# Patient Record
Sex: Female | Born: 1970 | Race: White | Hispanic: No | Smoking: Never smoker
Health system: Southern US, Community
[De-identification: ages and names within clinical notes are randomized; demographics above are authoritative.]

## PROBLEM LIST (undated history)

## (undated) DIAGNOSIS — E079 Disorder of thyroid, unspecified: Secondary | ICD-10-CM

## (undated) DIAGNOSIS — F32A Depression, unspecified: Secondary | ICD-10-CM

## (undated) DIAGNOSIS — J309 Allergic rhinitis, unspecified: Secondary | ICD-10-CM

## (undated) DIAGNOSIS — G43909 Migraine, unspecified, not intractable, without status migrainosus: Secondary | ICD-10-CM

## (undated) DIAGNOSIS — F329 Major depressive disorder, single episode, unspecified: Secondary | ICD-10-CM

## (undated) HISTORY — PX: APPENDECTOMY: SHX54

## (undated) HISTORY — PX: NASAL SINUS SURGERY: SHX719

## (undated) HISTORY — PX: ABDOMINAL HYSTERECTOMY: SHX81

## (undated) HISTORY — PX: TONSILLECTOMY: SUR1361

## (undated) HISTORY — PX: BACK SURGERY: SHX140

---
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
## 2002-05-19 DIAGNOSIS — N83209 Unspecified ovarian cyst, unspecified side: Secondary | ICD-10-CM

## 2002-05-19 DIAGNOSIS — C55 Malignant neoplasm of uterus, part unspecified: Secondary | ICD-10-CM

## 2002-05-19 HISTORY — DX: Unspecified ovarian cyst, unspecified side: N83.209

## 2002-05-19 HISTORY — DX: Malignant neoplasm of uterus, part unspecified: C55

## 2019-05-03 DIAGNOSIS — Z8542 Personal history of malignant neoplasm of other parts of uterus: Secondary | ICD-10-CM | POA: Insufficient documentation

## 2019-05-03 LAB — HM PAP SMEAR

## 2019-06-01 ENCOUNTER — Other Ambulatory Visit: Payer: Self-pay

## 2019-06-01 ENCOUNTER — Encounter (HOSPITAL_COMMUNITY): Payer: Self-pay | Admitting: Emergency Medicine

## 2019-06-01 ENCOUNTER — Emergency Department (HOSPITAL_COMMUNITY)
Admission: EM | Admit: 2019-06-01 | Discharge: 2019-06-01 | Disposition: A | Discharge status: Home / Self Care | Attending: Emergency Medicine | Admitting: Emergency Medicine

## 2019-06-01 DIAGNOSIS — Z79899 Other long term (current) drug therapy: Secondary | ICD-10-CM | POA: Insufficient documentation

## 2019-06-01 DIAGNOSIS — R519 Headache, unspecified: Secondary | ICD-10-CM | POA: Diagnosis not present

## 2019-06-01 DIAGNOSIS — E039 Hypothyroidism, unspecified: Secondary | ICD-10-CM | POA: Diagnosis not present

## 2019-06-01 HISTORY — DX: Disorder of thyroid, unspecified: E07.9

## 2019-06-01 HISTORY — DX: Depression, unspecified: F32.A

## 2019-06-01 HISTORY — DX: Migraine, unspecified, not intractable, without status migrainosus: G43.909

## 2019-06-01 HISTORY — DX: Allergic rhinitis, unspecified: J30.9

## 2019-06-01 MED ORDER — DIPHENHYDRAMINE HCL 50 MG/ML IJ SOLN
25.0000 mg | Freq: Once | INTRAMUSCULAR | Status: AC
  Administered 2019-06-01: 25 mg via INTRAVENOUS
  Filled 2019-06-01: qty 1

## 2019-06-01 MED ORDER — DEXAMETHASONE SODIUM PHOSPHATE 10 MG/ML IJ SOLN
10.0000 mg | Freq: Once | INTRAMUSCULAR | Status: AC
  Administered 2019-06-01: 03:00:00 10 mg via INTRAVENOUS
  Filled 2019-06-01: qty 1

## 2019-06-01 MED ORDER — METOCLOPRAMIDE HCL 5 MG/ML IJ SOLN
10.0000 mg | Freq: Once | INTRAMUSCULAR | Status: AC
  Administered 2019-06-01: 03:00:00 10 mg via INTRAVENOUS
  Filled 2019-06-01: qty 2

## 2019-06-01 MED ORDER — SODIUM CHLORIDE 0.9 % IV BOLUS
1000.0000 mL | Freq: Once | INTRAVENOUS | Status: AC
  Administered 2019-06-01: 03:00:00 1000 mL via INTRAVENOUS

## 2019-06-01 NOTE — ED Triage Notes (Signed)
Pt states that she has been having migraine (for which she relates hx of) since yesterday at 11am.  Pt c/o nausea w/o emesis, sensitive to light and sound.  Takes topamax 100 mg amovig 140 mg, rizatriptan benzoate10 mg. Has taken all her home meds with no relief.

## 2019-06-01 NOTE — Discharge Instructions (Signed)
Can continue your medications at home when needed. Follow-up with your neurologist as scheduled, sooner if any ongoing issues. Return here for any new/acute changes.

## 2019-06-01 NOTE — ED Provider Notes (Signed)
[facility] DEPT Provider Note   CSN: [id number] Arrival date & time: 06/01/19  0031     History Chief Complaint  Patient presents with  . Migraine    Tricia Moore is a 49 y.o. female.  The history is provided by the patient and medical records.  Migraine Associated symptoms include headaches.    49 year old female with history of seasonal allergies, depression, thyroid disease, migraine headache, presenting to ED with headache.  States migraine began yesterday morning and has been steadily worsening since onset.  Pain is generalized, throbbing in nature with associated photophobia and nausea.  She denies any focal numbness, weakness, dizziness, confusion, changes in speech, or difficulty walking.  She is not had any fever, chills, neck pain or stiffness.  She is currently maxed out on her home migraine medications and continues to have headache.  States she has only had to come to the ED once before and was given a "migraine cocktail" with good improvement.  She is not currently on anticoagulation.  Denies any falls or head trauma.  She does follow with neurology at [facility] for chronic headaches/migraines.  Past Medical History:  Diagnosis Date  . Allergic rhinitis   . Depression   . Migraines   . Thyroid disease    hypothyroid    There are no problems to display for this patient.   Past Surgical History:  Procedure Laterality Date  . ABDOMINAL HYSTERECTOMY    . APPENDECTOMY    . CESAREAN SECTION    . NASAL SINUS SURGERY    . TONSILLECTOMY       OB History   No obstetric history on file.     No family history on file.  Social History   Tobacco Use  . Smoking status: Never Smoker  . Smokeless tobacco: Never Used  Substance Use Topics  . Alcohol use: Never  . Drug use: Not on file    Home Medications Prior to Admission medications   Medication Sig Start Date End Date Taking? Authorizing Provider  buPROPion  (WELLBUTRIN XL) 150 MG 24 hr tablet Take 150 mg by mouth daily.    Yes [provider]  citalopram (CELEXA) 40 MG tablet Take 40 mg by mouth daily.  05/09/19  Yes [provider]  Erenumab-aooe (AIMOVIG) 140 MG/ML SOAJ Inject 140 mg into the skin every 30 (thirty) days. 05/25/19 05/25/20 Yes [provider]  fluticasone (FLONASE) 50 MCG/ACT nasal spray Place 2 sprays into both nostrils daily.    Yes [provider]  levothyroxine (SYNTHROID) 50 MCG tablet Take 50 mcg by mouth daily. 03/18/19  Yes [provider]  montelukast (SINGULAIR) 10 MG tablet Take 10 mg by mouth at bedtime.  03/14/19  Yes [provider]  OSPHENA 60 MG TABS Take 1 tablet by mouth daily. 03/14/19  Yes [provider]  [place] Med Name: Shaklee Vitamins - +D Boost 2,000 IU 8000 x2; OsteoMatrix 100% +DV of calcium and manganese; ALfalfa Complex; Vita Lea with Iron multivitamin.   Yes [provider]  rizatriptan (MAXALT) 10 MG tablet 1 at HA onset, may repeat in 2h, max dose 2 in 24h, 2 days/week. 03/28/19  Yes [provider]  terconazole (TERAZOL 3) 80 MG vaginal suppository Place 80 mg vaginally at bedtime as needed.    Yes [provider]  topiramate (TOPAMAX) 100 MG tablet Take 100 mg by mouth at bedtime. 05/25/19  Yes [provider]    Allergies  Penicillins, Hydrocodone-acetaminophen, Quinolones, and Alpha blocker quinazolines  Review of Systems   Review of Systems  Neurological: Positive for headaches.  All other systems reviewed and are negative.   Physical Exam Updated Vital Signs BP 101/61   Pulse 68   Temp 98.5 F (36.9 C) (Oral)   Resp 16   Ht 5\' 8"  (1.727 m)   Wt 79.8 kg   SpO2 94%   BMI 26.76 kg/m   Physical Exam Vitals and nursing note reviewed.  Constitutional:      General: She is not in acute distress.    Appearance: She is well-developed. She is not diaphoretic.      Comments: Lying in dark room, blanket over eyes  HENT:     Head: Normocephalic and atraumatic.     Right Ear: External ear normal.     Left Ear: External ear normal.  Eyes:     Conjunctiva/sclera: Conjunctivae normal.     Pupils: Pupils are equal, round, and reactive to light.  Neck:     Comments: No rigidity, no meningismus Cardiovascular:     Rate and Rhythm: Normal rate and regular rhythm.     Heart sounds: Normal heart sounds. No murmur.  Pulmonary:     Effort: Pulmonary effort is normal. No respiratory distress.     Breath sounds: Normal breath sounds. No wheezing or rhonchi.  Abdominal:     General: Bowel sounds are normal.     Palpations: Abdomen is soft.     Tenderness: There is no abdominal tenderness. There is no guarding.  Musculoskeletal:        General: Normal range of motion.     Cervical back: Full passive range of motion without pain, normal range of motion and neck supple. No rigidity.  Skin:    General: Skin is warm and dry.     Findings: No rash.  Neurological:     Mental Status: She is alert and oriented to person, place, and time.     Cranial Nerves: No cranial nerve deficit.     Sensory: No sensory deficit.     Motor: No tremor or seizure activity.     Comments: AAOx3, answering questions and following commands appropriately; equal strength UE and LE bilaterally; CN grossly intact; moves all extremities appropriately without ataxia; no focal neuro deficits or facial asymmetry appreciated  Psychiatric:        Behavior: Behavior normal.        Thought Content: Thought content normal.     ED Results / Procedures / Treatments   Labs (all labs ordered are listed, but only abnormal results are displayed) Labs Reviewed - No data to display  EKG None  Radiology No results found.  Procedures Procedures (including critical care time)  Medications Ordered in ED Medications  sodium chloride 0.9 % bolus 1,000 mL (0 mLs Intravenous Stopped 06/01/19 0358)    diphenhydrAMINE (BENADRYL) injection 25 mg (25 mg Intravenous Given 06/01/19 0321)  metoCLOPramide (REGLAN) injection 10 mg (10 mg Intravenous Given 06/01/19 0323)  dexamethasone (DECADRON) injection 10 mg (10 mg Intravenous Given 06/01/19 0322)    ED Course  I have reviewed the triage vital signs and the nursing notes.  Pertinent labs & imaging results that were available during my care of the patient were reviewed by me and considered in my medical decision making (see chart for details).    MDM Rules/Calculators/A&P    49 year old female presenting to the ED with migraine headache.  This began yesterday morning and  has been steadily worsening.  She has a history of same and is currently maxed out on her home medications.  Headache is generalized, throbbing in nature with associated photophobia and nausea which is typical for her.  She is afebrile and nontoxic in appearance.  Neurologic exam is nonfocal.  No meningeal signs.  States she has only had to come to the hospital one other time and was given a "migraine cocktail" with resolution of headache.  IV fluids and medications ordered here.  Will reassess.  5:07 AM Patient feeling better after migraine cocktail and wants to go home.  Feel this is reasonable as she remains neurologically intake with stable VS. Can continue her prescribed medications when needed.  Neurology follow-up as needed for any ongoing issues.  Return here for any new/acute changes.  Final Clinical Impression(s) / ED Diagnoses Final diagnoses:  Bad headache    Rx / DC Orders ED Discharge Orders    None       Larene Pickett, PA-C 06/01/19 0514    Shanon Rosser, MD 06/01/19 [phone]

## 2019-07-20 DIAGNOSIS — R768 Other specified abnormal immunological findings in serum: Secondary | ICD-10-CM | POA: Insufficient documentation

## 2019-11-18 DIAGNOSIS — Z91030 Bee allergy status: Secondary | ICD-10-CM | POA: Insufficient documentation

## 2019-12-28 DIAGNOSIS — K602 Anal fissure, unspecified: Secondary | ICD-10-CM | POA: Insufficient documentation

## 2020-02-22 DIAGNOSIS — K644 Residual hemorrhoidal skin tags: Secondary | ICD-10-CM | POA: Insufficient documentation

## 2020-04-10 ENCOUNTER — Ambulatory Visit (HOSPITAL_COMMUNITY)
Admission: EM | Admit: 2020-04-10 | Discharge: 2020-04-10 | Disposition: A | Discharge status: Home / Self Care | Attending: Family Medicine | Admitting: Family Medicine

## 2020-04-10 ENCOUNTER — Encounter (HOSPITAL_COMMUNITY): Payer: Self-pay | Admitting: Emergency Medicine

## 2020-04-10 ENCOUNTER — Other Ambulatory Visit: Payer: Self-pay

## 2020-04-10 ENCOUNTER — Ambulatory Visit (INDEPENDENT_AMBULATORY_CARE_PROVIDER_SITE_OTHER)

## 2020-04-10 DIAGNOSIS — S0592XA Unspecified injury of left eye and orbit, initial encounter: Secondary | ICD-10-CM | POA: Diagnosis not present

## 2020-04-10 DIAGNOSIS — S0012XA Contusion of left eyelid and periocular area, initial encounter: Secondary | ICD-10-CM

## 2020-04-10 DIAGNOSIS — H5712 Ocular pain, left eye: Secondary | ICD-10-CM

## 2020-04-10 MED ORDER — IBUPROFEN 800 MG PO TABS: 800.0000 mg | ORAL_TABLET | Freq: Three times a day (TID) | ORAL | 0 refills | Status: DC
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
## 2020-04-10 NOTE — Discharge Instructions (Signed)
Continue ice for 20 minutes every couple of hours Take ibuprofen 3 times a day with food See eye specialty if you fail to improve

## 2020-06-14 DIAGNOSIS — Z1211 Encounter for screening for malignant neoplasm of colon: Secondary | ICD-10-CM | POA: Insufficient documentation

## 2021-06-04 ENCOUNTER — Encounter: Payer: Self-pay | Admitting: Internal Medicine

## 2021-06-04 ENCOUNTER — Ambulatory Visit (INDEPENDENT_AMBULATORY_CARE_PROVIDER_SITE_OTHER): Payer: 59 | Admitting: Internal Medicine

## 2021-06-04 VITALS — BP 128/85 | HR 89 | Wt 207.2 lb

## 2021-06-04 DIAGNOSIS — F32A Depression, unspecified: Secondary | ICD-10-CM | POA: Diagnosis not present

## 2021-06-04 DIAGNOSIS — M797 Fibromyalgia: Secondary | ICD-10-CM | POA: Diagnosis not present

## 2021-06-04 DIAGNOSIS — M5416 Radiculopathy, lumbar region: Secondary | ICD-10-CM

## 2021-06-04 DIAGNOSIS — R635 Abnormal weight gain: Secondary | ICD-10-CM

## 2021-06-04 DIAGNOSIS — M4727 Other spondylosis with radiculopathy, lumbosacral region: Secondary | ICD-10-CM

## 2021-06-04 DIAGNOSIS — E039 Hypothyroidism, unspecified: Secondary | ICD-10-CM

## 2021-06-04 DIAGNOSIS — C55 Malignant neoplasm of uterus, part unspecified: Secondary | ICD-10-CM

## 2021-06-04 DIAGNOSIS — G43909 Migraine, unspecified, not intractable, without status migrainosus: Secondary | ICD-10-CM

## 2021-06-04 MED ORDER — LEVOTHYROXINE SODIUM 75 MCG PO TABS: 75.0000 ug | ORAL_TABLET | Freq: Every day | ORAL | 1 refills | Status: DC

## 2021-06-04 NOTE — Progress Notes (Signed)
° °  CC: establish care  HPI:[name] is a 51 y.o. female who presents for evaluation of establish care. Please see individual problem based A/P for details.  Patient here to establish care. She reports she was previously established with [facility], however, she recently changed insurance plans and [facility] does not accept her new plan. Furthermore, she is not able to see the specialists that she used to follow with. Requesting referral to spine, migraine clinic, rheumatologist.   Medications - buproprion 300 - Duloxetine 60  - Montelukast - 10mg  qhs - Lyrica 75  - Osphena 60  - aimovig 140  - Nurtec 75 - levothyroxine 100  - tramadol - methocarbamol -500 TID   Surg Hx - hysterectomy with oophorectomy for uterine cancer - appendectomy - tonsillectomy   Family hx - hypothyroid - ovarian and breast cancer - aunts breast cancer, mom uterine cancer,   Social Hx - alc none to 2-3 per week. No tobacco - works as Furniture conservator/restorer for [employer] - lives with service dog and cat, sister lives around corner, daughters talk - wanting to be more active agaiin.  Alergies - penicilin and hydrocodone hives, wasps/hornets systemic,    Depression, PHQ-9: Based on the patients  score we have.  Past Medical History:  Diagnosis Date   Allergic rhinitis    Depression    Migraines    Thyroid disease    hypothyroid   Review of Systems:   Review of Systems  Constitutional: Negative.   HENT: Negative.    Eyes: Negative.   Respiratory: Negative.    Cardiovascular: Negative.   Gastrointestinal: Negative.   Genitourinary: Negative.   Musculoskeletal: Negative.   Skin: Negative.   Neurological: Negative.   Endo/Heme/Allergies: Negative.   Psychiatric/Behavioral: Negative.      Physical Exam: Vitals:   06/04/21 0903  BP: 128/85  Pulse: 89  SpO2: 98%  Weight: 207 lb 3.2 oz (94 kg)     General: alert and oriented HEENT: Conjunctiva nl  , antiicteric sclerae, moist mucous membranes, no exudate or erythema Cardiovascular: Normal rate, regular rhythm.  No murmurs, rubs, or gallops Pulmonary : Equal breath sounds, No wheezes, rales, or rhonchi Abdominal: soft, nontender,  bowel sounds present Ext: No edema in lower extremities, no tenderness to palpation of lower extremities.   Assessment & Plan:   See Encounters Tab for problem based charting.  Patient discussed with Dr.  Christene Slates

## 2021-06-04 NOTE — Patient Instructions (Signed)
Dear Mrs. Davidson-Mayer,  Thank you for trusting [place] with your care.   I have sent in a refill for your thyroid medication. I have also placed a referral to neurosurgery, neurology, podiatry, rheumatology, dietician, weight management, and behavioral health.  We will plan to see you back in the clinic in 3 months to follow up. If the neurosurgeons choose not to manage your tramadol, please call our office to schedule an appointment approximately 1 week before you run out of medication to fill out a pain contract.

## 2021-06-05 ENCOUNTER — Encounter: Payer: Self-pay | Admitting: Internal Medicine

## 2021-06-05 DIAGNOSIS — M4727 Other spondylosis with radiculopathy, lumbosacral region: Secondary | ICD-10-CM | POA: Insufficient documentation

## 2021-06-05 DIAGNOSIS — G43909 Migraine, unspecified, not intractable, without status migrainosus: Secondary | ICD-10-CM | POA: Insufficient documentation

## 2021-06-05 DIAGNOSIS — M797 Fibromyalgia: Secondary | ICD-10-CM | POA: Insufficient documentation

## 2021-06-05 DIAGNOSIS — E039 Hypothyroidism, unspecified: Secondary | ICD-10-CM | POA: Insufficient documentation

## 2021-06-05 DIAGNOSIS — F32A Depression, unspecified: Secondary | ICD-10-CM | POA: Insufficient documentation

## 2021-06-05 DIAGNOSIS — C55 Malignant neoplasm of uterus, part unspecified: Secondary | ICD-10-CM | POA: Insufficient documentation

## 2021-06-05 LAB — TSH: TSH: 0.79 u[IU]/mL (ref 0.450–4.500)

## 2021-06-05 NOTE — Assessment & Plan Note (Addendum)
Patient states she has followed with rheumatology in the past for this issue. She currently takes Duloxetine and lyrica. Asymptomatic today. She is wanting to re-establish with rheumatologist. Sent referral to [facility] to assist in [place].

## 2021-06-05 NOTE — Assessment & Plan Note (Signed)
Patient denies symptoms today. Used to see psych and had counselor. She takes Bupropion 300 and Duloxetine 60.  Will continue current management. Referrals to behavioral health placed.

## 2021-06-05 NOTE — Assessment & Plan Note (Signed)
Takes methocarbamol 500 TID, Lyrica and duloxetine. She also takes Tramadol occasionally at night. Reports 20 pills are usually enough to last her 2 months.  She reports that she was receiving spinal injections through [facility] in [place].  Sent referral to [facility] to assist in management with spinal injections. If [facility] chooses not to manage her pain, will need to form a pain contract with patient.  - Continue Methocarbamol 500 TID, Lyrica, Duloxetine, and tramadol.

## 2021-06-05 NOTE — Assessment & Plan Note (Signed)
Patient reports she used to follow with neurologsit through migraine clinic in [place].  Requesting referral to neurology Asymptomatic today. Currently managed on Aimovig 140 and Nurtec 75. Will continue these medications for now.

## 2021-06-05 NOTE — Assessment & Plan Note (Signed)
Taking Osphena 60. Does endorse some hot flashes which she relates to perimenopause Continue Osphena

## 2021-06-05 NOTE — Assessment & Plan Note (Signed)
Patient reports being diagnosed 2021. Has had associated weight gain and fatigue.  TSH checked and wnl. Continue levothyroxine 100

## 2021-06-06 ENCOUNTER — Encounter: Payer: Self-pay | Admitting: Neurology

## 2021-06-06 NOTE — Progress Notes (Signed)
Internal Medicine Clinic Attending  Case discussed with Dr. Elliot Gurney  At the time of the visit.  We reviewed the residents history and exam and pertinent patient test results.  I agree with the assessment, diagnosis, and plan of care documented in the residents note.   Medically complex patient here to establish care. She was previously seeing several specialists, but insurance changed, so is requesting new referrals today. I think this is reasonable and Dr Elliot Gurney has placed referrals

## 2021-06-11 ENCOUNTER — Encounter (INDEPENDENT_AMBULATORY_CARE_PROVIDER_SITE_OTHER): Payer: Self-pay

## 2021-06-12 ENCOUNTER — Encounter: Payer: Self-pay | Admitting: Internal Medicine

## 2021-06-17 ENCOUNTER — Ambulatory Visit (INDEPENDENT_AMBULATORY_CARE_PROVIDER_SITE_OTHER): Payer: 59

## 2021-06-17 ENCOUNTER — Other Ambulatory Visit: Payer: Self-pay

## 2021-06-17 ENCOUNTER — Ambulatory Visit (INDEPENDENT_AMBULATORY_CARE_PROVIDER_SITE_OTHER): Payer: 59 | Admitting: Podiatry

## 2021-06-17 DIAGNOSIS — M205X1 Other deformities of toe(s) (acquired), right foot: Secondary | ICD-10-CM | POA: Diagnosis not present

## 2021-06-17 DIAGNOSIS — M79675 Pain in left toe(s): Secondary | ICD-10-CM | POA: Diagnosis not present

## 2021-06-17 DIAGNOSIS — M21619 Bunion of unspecified foot: Secondary | ICD-10-CM | POA: Diagnosis not present

## 2021-06-17 DIAGNOSIS — M79674 Pain in right toe(s): Secondary | ICD-10-CM

## 2021-06-17 DIAGNOSIS — M205X9 Other deformities of toe(s) (acquired), unspecified foot: Secondary | ICD-10-CM

## 2021-06-17 DIAGNOSIS — M205X2 Other deformities of toe(s) (acquired), left foot: Secondary | ICD-10-CM | POA: Diagnosis not present

## 2021-06-17 NOTE — Progress Notes (Signed)
Subjective:   Patient ID: Tricia Moore, female   DOB: 51 y.o.   MRN: [id number]   HPI Patient presents stating that she has had a lot of pain around her big toe joints right over left for the last 2 years does have a history of fibromyalgia and states that they are very sore when she tries to wear shoe gear and when she tries to be active with redness around the joints.  She has had previous injections which only gave her short-term relief and she is at her wits end as far as the discomfort she experiences.  She does not smoke would like to be active   Review of Systems  All other systems reviewed and are negative.      Objective:  Physical Exam Vitals and nursing note reviewed.  Constitutional:      Appearance: She is well-developed.  Pulmonary:     Effort: Pulmonary effort is normal.  Musculoskeletal:        General: Normal range of motion.  Skin:    General: Skin is warm.  Neurological:     Mental Status: She is alert.    Neurovascular status intact muscle strength was found to be adequate range of motion adequate with redness pain around the first metatarsal head right over left with mild restriction of motion no crepitus of the joint surface with prominence of the right over left foot with pain.  She is tried wider shoes she tried previous injection soaks oral anti-inflammatories without relief of the symptoms associated with this and feels like it is mostly pressure that causes the problem     Assessment:  Difficult to make complete determination with her history of fibromyalgia but possibility that the structural deformity with pain around the joint and pressure against the nerve is a big part of her problem     Plan:  H&P spent a great deal of time educating her on the difficulty of this condition and treatment options.  She has had numerous injections I do not recommend that approach and she is tried wider shoes she is tried soaks and other modalities so I do  think distal osteotomy would be her best option and I discussed this with her with possibility also lowering the first metatarsal at the same time.  I spent a great deal of time educating her on this allowed her to read consent form going over alternative treatments complications and she wants surgery with no expectations no guarantee this will solve her problems.  At this point scheduled for outpatient surgery after review understanding total recovery takes approximately 6 months and all questions answered and air fracture walker dispensed fitted today with instructions on how to use properly  X-rays indicate there is moderate elevation of the intermetatarsal angle with prominence around the first metatarsal head and elevated first metatarsal segment bilateral

## 2021-06-19 ENCOUNTER — Telehealth: Payer: Self-pay | Admitting: *Deleted

## 2021-06-19 ENCOUNTER — Other Ambulatory Visit: Payer: Self-pay | Admitting: Student in an Organized Health Care Education/Training Program

## 2021-06-19 MED ORDER — PREGABALIN 75 MG PO CAPS: 75.0000 mg | ORAL_CAPSULE | Freq: Two times a day (BID) | ORAL | 3 refills | Status: DC

## 2021-06-19 NOTE — Telephone Encounter (Signed)
I have corrected the Rx and sent 75mg  bid.

## 2021-06-19 NOTE — Telephone Encounter (Signed)
Call from patient stating that she is changing Pharmacies due to her insurance coverage changing.  Patient stated that she was previously on Pregabalin 75 mg and not the ordered 50 mg.  Wants to change back to the 75 mg that was ordered by her previous doctor.  Patient will bring in previous bottle from her last physician so that the change can be done.

## 2021-06-20 ENCOUNTER — Other Ambulatory Visit: Payer: Self-pay | Admitting: Podiatry

## 2021-06-20 DIAGNOSIS — M205X2 Other deformities of toe(s) (acquired), left foot: Secondary | ICD-10-CM

## 2021-06-20 DIAGNOSIS — M205X1 Other deformities of toe(s) (acquired), right foot: Secondary | ICD-10-CM

## 2021-06-24 ENCOUNTER — Telehealth: Payer: Self-pay | Admitting: Urology

## 2021-06-24 NOTE — Telephone Encounter (Signed)
DOS - 07/09/21  [place] BUNIONECTOMY RIGHT --- [phone]  Friday EFFECTIVE DATE - 05/19/21  RECEIVED FAX FROM [facility] [employer] STATING THAT CPT CODE 72820 HAS BEEN APPROVED, AUTH # [id number], GOOD FROM 06/17/21 - 09/15/21.

## 2021-07-03 ENCOUNTER — Ambulatory Visit: Payer: 59 | Admitting: Behavioral Health

## 2021-07-03 DIAGNOSIS — F331 Major depressive disorder, recurrent, moderate: Secondary | ICD-10-CM

## 2021-07-03 DIAGNOSIS — F419 Anxiety disorder, unspecified: Secondary | ICD-10-CM

## 2021-07-03 NOTE — BH Specialist Note (Signed)
Integrated Behavioral Health via Telemedicine Visit  07/03/2021 Tricia Moore [id number]  Number of [place] Clinician visits: 1/6 Session Start time: 9:00am Session End time: 9:30am Total time in minutes: 30 min  Referring Provider: Dr. Rosine Door, MD Patient/Family location: Pt is home in private [facility] Provider location: [facility] Office All persons participating in visit: Pt & Clinician Types of Service: Health Promotion and Introduction only  I connected with Tricia Moore and/or Tricia Moore  self  via  Telephone or Video Enabled Telemedicine Application  (Video is Caregility application) and verified that I am speaking with the correct person using two identifiers. Discussed confidentiality: Yes   I discussed the limitations of telemedicine and the availability of in person appointments.  Discussed there is a possibility of technology failure and discussed alternative modes of communication if that failure occurs.  I discussed that engaging in this telemedicine visit, they consent to the provision of behavioral healthcare and the services will be billed under their insurance.  Patient and/or legal guardian expressed understanding and consented to Telemedicine visit: Yes   Presenting Concerns: Patient and/or family reports the following symptoms/concerns: constant pain, Sx of her Fibromyalgia, neck & lower back discomfort, feet hurt constantly, reduced memory capacity, & difficult adj to having so many health issues since her hysterectomy. Pt was an EMT for 10 yrs, she has Hx of being a busy & active person-her health status changes are causing her mental stress & the stress of caring for her Ex-Husb's health adds to her psychological distress.  Pt's Ex-Husb lives w/her Str & BIL. They have a handicap accessible first floor bedrm w/bath for him to live in. She attends his healthcare visits wkly or biwkly. The Px stress of her 17 yrs  of caregiving have taken its toll on her body. She is trying to lose the 40# she gained & has lost 13# of this.  Pt is fatigued & needs a daily nap. 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   Pt did her taxes for 2022 & it made her dep'd to learn how little she earned, even though she worked PT @ [employer] to try & bring in more income. She could not cont to work PT for [employer] it wore her out.   Pt has upcoming foot surgery @ the end of Feb. Her Dtr & Str will help her in recovery bc she cannot drive for 4 wks.  Duration of problem: over a year; Severity of problem: moderate  Patient and/or Family's Strengths/Protective Factors: Social connections, Social and Emotional competence, Concrete supports in place (healthy food, safe environments, etc.), Sense of purpose, and Physical Health (exercise, healthy diet, medication compliance, etc.)-Pt routinely gets up to execise in the morning & gs to the Gym 3d/wk. She walks & does PT exer 5-6 X wk.  Goals Addressed: Patient will:  Reduce symptoms of: anxiety, depression, and stress   Increase knowledge and/or ability of: coping skills and stress reduction   Demonstrate ability to: Increase healthy adjustment to current life circumstances and Begin healthy grieving over loss  Progress towards Goals: Estb'd today; Pt will attend all scheduled appt times.  Interventions: Interventions utilized:  Supportive Counseling w/Intro & Assess for needs-Pt would like to meet q3wks Standardized Assessments completed:  screeners prn  Patient and/or Family Response: Pt receptive to call today, appreciative & requests future appt  Assessment: Patient currently experiencing pain &  psychological stressors due to her health status changes & the care of her ex-Husb; it is difficult to coordinate the two roles.   Patient may benefit from routine appts  to improve mental health wellness.  Plan: Follow up with behavioral health clinician on : q3wks for now Behavioral recommendations: Keep a notebook of your concerns btwn sessions to keep [place] focused Referral(s): [place] (In Clinic)  I discussed the assessment and treatment plan with the patient and/or parent/guardian. They were provided an opportunity to ask questions and all were answered. They agreed with the plan and demonstrated an understanding of the instructions.   They were advised to call back or seek an in-person evaluation if the symptoms worsen or if the condition fails to improve as anticipated.  Donnetta Hutching, LMFT

## 2021-07-08 ENCOUNTER — Other Ambulatory Visit: Payer: Self-pay | Admitting: Internal Medicine

## 2021-07-08 MED ORDER — HYDROMORPHONE HCL 4 MG PO TABS: 20.0000 mg | ORAL_TABLET | ORAL | 0 refills | Status: DC | PRN

## 2021-07-08 NOTE — Addendum Note (Signed)
Addended by: Wallene Huh on: 07/08/2021 02:45 PM   Modules accepted: Orders

## 2021-07-09 ENCOUNTER — Encounter: Payer: Self-pay | Admitting: Podiatry

## 2021-07-09 DIAGNOSIS — M2011 Hallux valgus (acquired), right foot: Secondary | ICD-10-CM | POA: Diagnosis not present

## 2021-07-13 ENCOUNTER — Encounter: Payer: Self-pay | Admitting: Podiatry

## 2021-07-15 ENCOUNTER — Encounter: Payer: Self-pay | Admitting: Podiatry

## 2021-07-15 ENCOUNTER — Other Ambulatory Visit: Payer: Self-pay

## 2021-07-15 ENCOUNTER — Ambulatory Visit (INDEPENDENT_AMBULATORY_CARE_PROVIDER_SITE_OTHER): Payer: 59 | Admitting: Podiatry

## 2021-07-15 ENCOUNTER — Ambulatory Visit (INDEPENDENT_AMBULATORY_CARE_PROVIDER_SITE_OTHER): Payer: 59

## 2021-07-15 DIAGNOSIS — M21611 Bunion of right foot: Secondary | ICD-10-CM

## 2021-07-15 DIAGNOSIS — E8881 Metabolic syndrome: Secondary | ICD-10-CM | POA: Insufficient documentation

## 2021-07-15 DIAGNOSIS — M21619 Bunion of unspecified foot: Secondary | ICD-10-CM

## 2021-07-15 DIAGNOSIS — E782 Mixed hyperlipidemia: Secondary | ICD-10-CM | POA: Insufficient documentation

## 2021-07-15 DIAGNOSIS — G9332 Myalgic encephalomyelitis/chronic fatigue syndrome: Secondary | ICD-10-CM | POA: Insufficient documentation

## 2021-07-15 DIAGNOSIS — Z6830 Body mass index (BMI) 30.0-30.9, adult: Secondary | ICD-10-CM | POA: Insufficient documentation

## 2021-07-15 DIAGNOSIS — Z780 Asymptomatic menopausal state: Secondary | ICD-10-CM | POA: Insufficient documentation

## 2021-07-15 DIAGNOSIS — F419 Anxiety disorder, unspecified: Secondary | ICD-10-CM | POA: Insufficient documentation

## 2021-07-15 DIAGNOSIS — E88819 Insulin resistance, unspecified: Secondary | ICD-10-CM | POA: Insufficient documentation

## 2021-07-15 DIAGNOSIS — M2550 Pain in unspecified joint: Secondary | ICD-10-CM | POA: Insufficient documentation

## 2021-07-15 DIAGNOSIS — G4700 Insomnia, unspecified: Secondary | ICD-10-CM | POA: Insufficient documentation

## 2021-07-15 DIAGNOSIS — N898 Other specified noninflammatory disorders of vagina: Secondary | ICD-10-CM | POA: Insufficient documentation

## 2021-07-15 DIAGNOSIS — N289 Disorder of kidney and ureter, unspecified: Secondary | ICD-10-CM | POA: Insufficient documentation

## 2021-07-15 DIAGNOSIS — K760 Fatty (change of) liver, not elsewhere classified: Secondary | ICD-10-CM | POA: Insufficient documentation

## 2021-07-15 DIAGNOSIS — M4800 Spinal stenosis, site unspecified: Secondary | ICD-10-CM | POA: Insufficient documentation

## 2021-07-15 DIAGNOSIS — R454 Irritability and anger: Secondary | ICD-10-CM | POA: Insufficient documentation

## 2021-07-15 DIAGNOSIS — R768 Other specified abnormal immunological findings in serum: Secondary | ICD-10-CM | POA: Insufficient documentation

## 2021-07-15 NOTE — Progress Notes (Signed)
Subjective:   Patient ID: Tricia Moore, female   DOB: 51 y.o.   MRN: [id number]   HPI Patient states she is doing very well with surgery with minimal discomfort and did not take the pain medicine added been written and when I evaluated that it was not transcribed correctly it was too high of a dose and I am glad that she did not take the higher dose.  She is feeling fine now and she did give me the medication today to dispose of.  Patient is wearing her boot and walking with a good heel toe gait   ROS      Objective:  Physical Exam  Neurovascular status intact negative Bevelyn Buckles' sign noted wound edges are coapted well hallux in rectus position range of motion good and excellent reduction of the deformity.  No redness on the side of the metatarsal that has been there preoperatively and so far looks good with patient having the deformity on the left that she wants corrected but we are not can to do it until we have gotten a little further along with the right     Assessment:  Doing well post osteotomy first metatarsal right foot     Plan:  H&P x-rays reviewed and surgical shoe was dispensed currently.  I advised her on continued elevation compression immobilization and will be seen back in the next 3 to 4 weeks or earlier if needed and in about 3 weeks she may start to wear tennis shoes.  I encouraged her on calling us with any issues or questions that occur and she can take Tylenol and Advil as needed for discomfort which has been controlling her well  X-rays indicate osteotomy is healing very well fixation looks good position looks good joint congruence

## 2021-07-16 NOTE — Telephone Encounter (Signed)
Please advise 

## 2021-07-16 NOTE — Telephone Encounter (Signed)
Patient was seen on 07/15/21 by Dr Paulla Dolly.

## 2021-07-17 NOTE — Telephone Encounter (Signed)
Should not need any pain medicine at this time. I already discussed with her

## 2021-07-19 ENCOUNTER — Other Ambulatory Visit: Payer: Self-pay

## 2021-07-22 ENCOUNTER — Other Ambulatory Visit (HOSPITAL_COMMUNITY)
Admission: RE | Admit: 2021-07-22 | Discharge: 2021-07-22 | Disposition: A | Discharge status: Home / Self Care | Payer: 59 | Source: Ambulatory Visit | Attending: Obstetrics and Gynecology | Admitting: Obstetrics and Gynecology

## 2021-07-22 ENCOUNTER — Telehealth: Payer: Self-pay | Admitting: *Deleted

## 2021-07-22 ENCOUNTER — Ambulatory Visit (INDEPENDENT_AMBULATORY_CARE_PROVIDER_SITE_OTHER): Payer: 59 | Admitting: Obstetrics and Gynecology

## 2021-07-22 ENCOUNTER — Other Ambulatory Visit: Payer: Self-pay

## 2021-07-22 ENCOUNTER — Telehealth: Payer: Self-pay | Admitting: Genetic Counselor

## 2021-07-22 ENCOUNTER — Encounter: Payer: Self-pay | Admitting: Obstetrics and Gynecology

## 2021-07-22 VITALS — BP 121/80 | HR 98 | Ht 68.0 in | Wt 203.4 lb

## 2021-07-22 DIAGNOSIS — N898 Other specified noninflammatory disorders of vagina: Secondary | ICD-10-CM | POA: Diagnosis not present

## 2021-07-22 DIAGNOSIS — Z113 Encounter for screening for infections with a predominantly sexual mode of transmission: Secondary | ICD-10-CM | POA: Insufficient documentation

## 2021-07-22 DIAGNOSIS — Z1231 Encounter for screening mammogram for malignant neoplasm of breast: Secondary | ICD-10-CM

## 2021-07-22 DIAGNOSIS — Z01419 Encounter for gynecological examination (general) (routine) without abnormal findings: Secondary | ICD-10-CM | POA: Diagnosis not present

## 2021-07-22 DIAGNOSIS — Z8542 Personal history of malignant neoplasm of other parts of uterus: Secondary | ICD-10-CM

## 2021-07-22 DIAGNOSIS — Z803 Family history of malignant neoplasm of breast: Secondary | ICD-10-CM

## 2021-07-22 MED ORDER — OSPHENA 60 MG PO TABS: 1.0000 | ORAL_TABLET | Freq: Every day | ORAL | 10 refills | Status: AC

## 2021-07-22 NOTE — Telephone Encounter (Signed)
Shes ok to drive carefully

## 2021-07-22 NOTE — Telephone Encounter (Signed)
Scheduled appt per 3/6 referral. Pt is aware of appt date and time. Pt is aware to arrive 15 mins prior to appt time and to bring and updated insurance card. Pt is aware of appt location.   

## 2021-07-22 NOTE — Progress Notes (Signed)
GYNECOLOGY ANNUAL PREVENTATIVE CARE ENCOUNTER NOTE  History:     Tricia Moore is a 51 y.o. G64P0 female here for a routine annual gynecologic exam.  Current complaints: none.   Denies abnormal vaginal bleeding, discharge, pelvic pain, problems with intercourse or other gynecologic concerns. Pt has normal mammogram, but strong family history of breast cancer.  She is also s/p hysterectomy due to uterine cancer treated in [place].   Gynecologic History No LMP recorded. Patient has had a hysterectomy. Contraception: status post hysterectomy Last Pap: 05/03/2019. Results were: normal with negative HPV Last mammogram: 03/27/21. Results were: normal  Obstetric History OB History  Gravida Para Term Preterm AB Living  3         1  SAB IAB Ectopic Multiple Live Births          1    # Outcome Date GA Lbr Len/2nd Weight Sex Delivery Anes PTL Lv  3 Gravida           2 Gravida           1 [place]             Past Medical History:  Diagnosis Date   Allergic rhinitis    Depression    Migraines    Ovarian cyst 2004   Thyroid disease    hypothyroid   Uterine cancer ([place]) 2004    Past Surgical History:  Procedure Laterality Date   ABDOMINAL HYSTERECTOMY     APPENDECTOMY     CESAREAN SECTION     NASAL SINUS SURGERY     TONSILLECTOMY      Current Outpatient Medications on File Prior to Visit  Medication Sig Dispense Refill   buPROPion (WELLBUTRIN XL) 300 MG 24 hr tablet 1 tablet in the morning     DULoxetine (CYMBALTA) 60 MG capsule Take 1 capsule by mouth daily.     DULoxetine (CYMBALTA) 60 MG capsule Take by mouth.     EPINEPHrine 0.3 mg/0.3 mL IJ SOAJ injection INJECT 0.3 ML (0.3 MG DOSE) INTO THE MUSCLE ONCE AS NEEDED FOR ANAPHYLAXIS FOR UP TO 1 DOSE     Erenumab-aooe (AIMOVIG) 140 MG/ML SOAJ See admin instructions.     fexofenadine (ALLEGRA) 180 MG tablet Take 180 mg by mouth daily.     fluticasone (FLONASE) 50 MCG/ACT nasal spray two sprays by Both Nostrils  route daily.     guaiFENesin (MUCINEX) 600 MG 12 hr tablet Take by mouth 2 (two) times daily.     Hydrocortisone Acetate (CORTIFOAM) 10 % FOAM INSERT 1 APPLICATORFUL RECTALLY TWICE A DAY FOR 10 DAYS     ibuprofen (ADVIL) 800 MG tablet Take 1 tablet (800 mg total) by mouth 3 (three) times daily. 21 tablet 0   levothyroxine (SYNTHROID) 75 MCG tablet TAKE 1 TABLET BY MOUTH EVERY DAY 30 tablet 1   methocarbamol (ROBAXIN) 500 MG tablet Take 1 tablet by mouth 3 (three) times daily.     montelukast (SINGULAIR) 10 MG tablet 1 tablet     naproxen (NAPROSYN) 500 MG tablet TAKE 1 TABLET TWICE A DAY WITH MEALS FOR TOE PAIN. ALWAYS TAKE WITH FOOD     omeprazole (PRILOSEC) 20 MG capsule Take 20 mg by mouth daily.     OVER THE COUNTER MEDICATION Med Name: Shaklee Vitamins - +D Boost 2,000 IU 8000 x2; OsteoMatrix 100% +DV of calcium and manganese; ALfalfa Complex; Vita Lea with Iron multivitamin.     pregabalin (LYRICA) 75 MG capsule Take 1 capsule (75 mg  total) by mouth 2 (two) times daily. 180 capsule 3   Rimegepant Sulfate (NURTEC) 75 MG TBDP Take by mouth.     terconazole (TERAZOL 3) 80 MG vaginal suppository Place 80 mg vaginally at bedtime as needed.      testosterone (ANDROGEL) 50 MG/5GM (1%) GEL Place 5 g onto the skin daily.     zonisamide (ZONEGRAN) 100 MG capsule 1 capsule     Botulinum Toxin Type A (BOTOX) 200 units SOLR INJECT UP TO 200 UNITS INTO THE MUSCLES OF THE HEAD , NECK , AND SHOULDERS BY PROVIDER EVERY 84 DAYS , DISCARD ANY UNUSED PORTION     buPROPion (WELLBUTRIN XL) 150 MG 24 hr tablet Take 150 mg by mouth daily.  (Patient not taking: Reported on 07/22/2021)     citalopram (CELEXA) 40 MG tablet Take 40 mg by mouth daily.  (Patient not taking: Reported on 07/22/2021)     cycloSPORINE (RESTASIS) 0.05 % ophthalmic emulsion Place one drop into the left eye 2 (two) times daily. (Patient not taking: Reported on 07/22/2021)     diazepam (VALIUM) 5 MG tablet Take 1 tablet 1 hour prior to MRI, take the  other tablet with you to the test and use if needed. Do not drive yourself to or from the procedure. (Patient not taking: Reported on 07/22/2021)     doxycycline (VIBRAMYCIN) 100 MG capsule  (Patient not taking: Reported on 07/22/2021)     Flavoring Agent (ALFALFA FLAVOR) POWD Take by mouth. (Patient not taking: Reported on 07/22/2021)     fluconazole (DIFLUCAN) 150 MG tablet  (Patient not taking: Reported on 07/22/2021)     fluticasone (FLONASE) 50 MCG/ACT nasal spray Place 2 sprays into both nostrils daily.  (Patient not taking: Reported on 07/22/2021)     HYDROmorphone (DILAUDID) 4 MG tablet Take 5 tablets (20 mg total) by mouth every 4 (four) hours as needed for severe pain. (Patient not taking: Reported on 07/22/2021) 30 tablet 0   levothyroxine (SYNTHROID) 75 MCG tablet Take 1 tablet by mouth daily. (Patient not taking: Reported on 07/22/2021)     methocarbamol (ROBAXIN) 750 MG tablet 1 tablet (Patient not taking: Reported on 07/22/2021)     methylPREDNISolone (MEDROL DOSEPAK) 4 MG TBPK tablet See admin instructions. (Patient not taking: Reported on 07/22/2021)     montelukast (SINGULAIR) 10 MG tablet Take 10 mg by mouth at bedtime.  (Patient not taking: Reported on 07/22/2021)     pregabalin (LYRICA) 75 MG capsule Take by mouth. (Patient not taking: Reported on 07/22/2021)     Pseudoephedrine-guaiFENesin 40-400 MG TABS Take 1 tablet by mouth every 12 (twelve) hours. (Patient not taking: Reported on 07/22/2021)     rizatriptan (MAXALT) 10 MG tablet 1 at HA onset, may repeat in 2h, max dose 2 in 24h, 2 days/week. (Patient not taking: Reported on 07/22/2021)     topiramate (TOPAMAX) 100 MG tablet Take 100 mg by mouth at bedtime. (Patient not taking: Reported on 07/22/2021)     traMADol (ULTRAM) 50 MG tablet Take 50 mg by mouth every 6 (six) hours as needed. (Patient not taking: Reported on 07/22/2021)     triamcinolone cream (KENALOG) 0.1 % Apply topically. (Patient not taking: Reported on 07/22/2021)     No current  facility-administered medications on file prior to visit.    Allergies  Allergen Reactions   Bee Venom Swelling    histaimine reaction, skin thickens, bruising & swelling   Penicillins Anaphylaxis    hives   Hydrocodone-Acetaminophen Hives  hives   Quinolones     Other reaction(s): Other "Crosses the blood brain barrier and causes me to space out"   Alpha Blocker Quinazolines     Social History:  reports that she has never smoked. She has never used smokeless tobacco. She reports current alcohol use. She reports that she does not currently use drugs.  Family History  Problem Relation Age of Onset   Depression Mother    Thyroid disease Mother    Fibromyalgia Mother    Mitral valve prolapse Father    Heart disease Father    High Cholesterol Father    Breast cancer Sister    Uterine cancer Maternal Grandmother    Alzheimer's disease Maternal Grandfather    Prostate cancer Paternal Grandmother     The following portions of the patient's history were reviewed and updated as appropriate: allergies, current medications, past family history, past medical history, past social history, past surgical history and problem list.  Review of Systems Pertinent items noted in HPI and remainder of comprehensive ROS otherwise negative.  Physical Exam:  BP 121/80    Pulse 98    Ht '5\' 8"'$  (1.727 m)    Wt 203 lb 6.4 oz (92.3 kg)    BMI 30.93 kg/m  CONSTITUTIONAL: Well-developed, well-nourished female in no acute distress.  HENT:  Normocephalic, atraumatic, External right and left ear normal. Oropharynx is clear and moist EYES: Conjunctivae and EOM are normal. NECK: Normal range of motion, supple, no masses.  Normal thyroid.  SKIN: Skin is warm and dry. No rash noted. Not diaphoretic. No erythema. No pallor. MUSCULOSKELETAL: Normal range of motion. No tenderness.  No cyanosis, clubbing, or edema.  2+ distal pulses. NEUROLOGIC: Alert and oriented to person, place, and time. Normal reflexes,  muscle tone coordination.  PSYCHIATRIC: Normal mood and affect. Normal behavior. Normal judgment and thought content. CARDIOVASCULAR: Normal heart rate noted, regular rhythm RESPIRATORY: Clear to auscultation bilaterally. Effort and breath sounds normal, no problems with respiration noted. BREASTS: Symmetric in size. No masses, tenderness, skin changes, nipple drainage, or lymphadenopathy bilaterally. Performed in the presence of a chaperone. ABDOMEN: Soft, no distention noted.  No tenderness, rebound or guarding.  PELVIC: Normal appearing external genitalia and urethral meatus; normal appearing vaginal mucosa.  Cervix and uterus absent.  No abnormal discharge noted.  Performed in the presence of a chaperone.   Assessment and Plan:    1. Women's annual routine gynecological examination Normal annual exam  2. Breast cancer screening by mammogram Reviewed mammogram, normal but recommended Breast MRI  3. Vaginal dryness Pt has taken osphena successfully.  Per pt Gyn onc recommended against topical estrogen for vaginal symptoms. - OSPHENA 60 MG TABS; Take 1 tablet by mouth daily.  Dispense: 30 tablet; Refill: 10  4. Routine screening for STI (sexually transmitted infection)  - Cervicovaginal ancillary only( [place]) - HepB+HepC+HIV Panel - [facility]  5. Family history of breast cancer Will get breast MRI if possible, referral to genetics to see if patient needs testing for genetic mutation - Ambulatory referral to [place] [facility]; Future  6. History of uterine cancer    Breast MRI scheduled Routine preventative health maintenance measures emphasized. Please refer to After Visit Summary for other counseling recommendations.     F/u in 1 year for annual exam  Lynnda Shields, MD, [place], [facility] for [employer], [place]

## 2021-07-22 NOTE — Telephone Encounter (Signed)
Patient is calling to ask when can she start back driving. She had  surgery two weeks ago. Please advise. ?

## 2021-07-22 NOTE — Progress Notes (Signed)
Pt in office to establish care. Pt had hysterectomy in 2004, still has left ovary.  ?Pt requesting STD testing.  ? ?Mammogram: 04/2021 ?Colonoscopy: 2022 ?Pt requesting refill for osphenia  ?

## 2021-07-23 ENCOUNTER — Encounter: Payer: Self-pay | Admitting: Obstetrics and Gynecology

## 2021-07-23 ENCOUNTER — Encounter: Payer: Self-pay | Admitting: *Deleted

## 2021-07-23 LAB — RPR: RPR Ser Ql: NONREACTIVE

## 2021-07-23 LAB — HEPB+HEPC+HIV PANEL
HIV Screen 4th Generation wRfx: NONREACTIVE
Hep B C IgM: NEGATIVE
Hep B Core Total Ab: NEGATIVE
Hep B E Ab: NEGATIVE
Hep B E Ag: NEGATIVE
Hep B Surface Ab, Qual: REACTIVE
Hep C Virus Ab: NONREACTIVE
Hepatitis B Surface Ag: NEGATIVE

## 2021-07-23 LAB — CERVICOVAGINAL ANCILLARY ONLY
Bacterial Vaginitis (gardnerella): NEGATIVE
Chlamydia: NEGATIVE
Comment: NEGATIVE
Comment: NEGATIVE
Comment: NEGATIVE
Comment: NORMAL
Neisseria Gonorrhea: NEGATIVE
Trichomonas: NEGATIVE

## 2021-07-23 NOTE — Telephone Encounter (Signed)
Patient has been notified of recommendations.

## 2021-07-29 NOTE — Progress Notes (Unsigned)
NEUROLOGY CONSULTATION NOTE  Tricia Moore MRN: [id number] DOB: 30-Jan-1971  Referring provider: Velna Ochs, MD Primary care provider: Delene Ruffini, MD  Reason for consult:  headache  Assessment/Plan:   ***   Subjective:  Tricia Moore is a 51 year old ***-handed female with hypothyroidism, allergic rhinitis, depression and history of uterine cancer who presents for headaches.  History supplemented by prior neurologist's and referring provider's notes.  Onset:  *** Location:  *** Quality:  *** Intensity:  ***.  *** denies new headache, thunderclap headache or severe headache that wakes *** from sleep. Aura:  *** Prodrome:  *** Postdrome:  *** Associated symptoms:  ***.  *** denies associated unilateral numbness or weakness. Duration:  *** Frequency:  *** Frequency of abortive medication: *** Triggers:  *** Relieving factors:  *** Activity:  ***  She has neck pain radiating into both shoulders.  MRI of cervical spine on 02/18/2021 showed moderately severe facet joint arthritis on the left at C3-4, foraminal stenosis on the left at C5-6 and moderate degenerative disc disease at C6-7 with type 1 Modic changes in the adjacent endplates.  Current NSAIDS/analgesics:  tramadol Current triptans:  none Current ergotamine:  none Current anti-emetic:  none Current muscle relaxants:  methocarbamol Current Antihypertensive medications:  none Current Antidepressant medications:  duloxetine '60mg'$  daily, bupropion '300mg'$  Current Anticonvulsant medications:  pregablin '75mg'$  daily Current anti-CGRP:  Aimovig '140mg'$ , Nurtec (rescue) Current Vitamins/Herbal/Supplements:  none Current Antihistamines/Decongestants:  none Other therapy:  Botox Hormone/birth control:  none Other medications:  levothyroxine  Past NSAIDS/analgesics:  ibuprofen, naproxen, meloxicam, acetaminophen Past abortive triptans:  *** Past abortive ergotamine:  *** Past muscle relaxants:   tizanidine, cyclobenzaprine, Norflex, Soma Past anti-emetic:  *** Past antihypertensive medications:  atenolol Past antidepressant medications:  citalopram Past anticonvulsant medications:  topiramate, zonisamide Past anti-CGRP:  *** Past vitamins/Herbal/Supplements:  *** Past antihistamines/decongestants:  Flonase Other past therapies:  ***  Caffeine:  none Alcohol:  once weekly Smoker:  no Diet:  ***, rarely Sprite Exercise:  5 times weekly Depression:  yes; Anxiety:  *** Other pain:  left lumbar radiculopathy.  MRI of lumbar spine on 01/07/2021 showed degenerative disc disease and facet arthrosis most notable for a small synovial cyst emanating from the right facet joint at L4-L5 producing severe right lateral recess narrowing and possible impingement of the traversing right L5 nerve root.   Sleep hygiene:  *** Family history of headache:  ***      PAST MEDICAL HISTORY: Past Medical History:  Diagnosis Date   Allergic rhinitis    Depression    Migraines    Ovarian cyst 2004   Thyroid disease    hypothyroid   Uterine cancer ([place]) 2004    PAST SURGICAL HISTORY: Past Surgical History:  Procedure Laterality Date   ABDOMINAL HYSTERECTOMY     APPENDECTOMY     CESAREAN SECTION     NASAL SINUS SURGERY     TONSILLECTOMY      MEDICATIONS: Current Outpatient Medications on File Prior to Visit  Medication Sig Dispense Refill   Botulinum Toxin Type A (BOTOX) 200 units SOLR INJECT UP TO 200 UNITS INTO THE MUSCLES OF THE HEAD , NECK , AND SHOULDERS BY PROVIDER EVERY 84 DAYS , DISCARD ANY UNUSED PORTION     buPROPion (WELLBUTRIN XL) 150 MG 24 hr tablet Take 150 mg by mouth daily.  (Patient not taking: Reported on 07/22/2021)     buPROPion (WELLBUTRIN XL) 300 MG 24 hr tablet 1 tablet in the morning  citalopram (CELEXA) 40 MG tablet Take 40 mg by mouth daily.  (Patient not taking: Reported on 07/22/2021)     cycloSPORINE (RESTASIS) 0.05 % ophthalmic emulsion Place one drop into  the left eye 2 (two) times daily. (Patient not taking: Reported on 07/22/2021)     diazepam (VALIUM) 5 MG tablet Take 1 tablet 1 hour prior to MRI, take the other tablet with you to the test and use if needed. Do not drive yourself to or from the procedure. (Patient not taking: Reported on 07/22/2021)     doxycycline (VIBRAMYCIN) 100 MG capsule  (Patient not taking: Reported on 07/22/2021)     DULoxetine (CYMBALTA) 60 MG capsule Take 1 capsule by mouth daily.     DULoxetine (CYMBALTA) 60 MG capsule Take by mouth.     EPINEPHrine 0.3 mg/0.3 mL IJ SOAJ injection INJECT 0.3 ML (0.3 MG DOSE) INTO THE MUSCLE ONCE AS NEEDED FOR ANAPHYLAXIS FOR UP TO 1 DOSE     Erenumab-aooe (AIMOVIG) 140 MG/ML SOAJ See admin instructions.     fexofenadine (ALLEGRA) 180 MG tablet Take 180 mg by mouth daily.     Flavoring Agent (ALFALFA FLAVOR) POWD Take by mouth. (Patient not taking: Reported on 07/22/2021)     fluconazole (DIFLUCAN) 150 MG tablet  (Patient not taking: Reported on 07/22/2021)     fluticasone (FLONASE) 50 MCG/ACT nasal spray Place 2 sprays into both nostrils daily.  (Patient not taking: Reported on 07/22/2021)     fluticasone (FLONASE) 50 MCG/ACT nasal spray two sprays by Both Nostrils route daily.     guaiFENesin (MUCINEX) 600 MG 12 hr tablet Take by mouth 2 (two) times daily.     Hydrocortisone Acetate (CORTIFOAM) 10 % FOAM INSERT 1 APPLICATORFUL RECTALLY TWICE A DAY FOR 10 DAYS     HYDROmorphone (DILAUDID) 4 MG tablet Take 5 tablets (20 mg total) by mouth every 4 (four) hours as needed for severe pain. (Patient not taking: Reported on 07/22/2021) 30 tablet 0   ibuprofen (ADVIL) 800 MG tablet Take 1 tablet (800 mg total) by mouth 3 (three) times daily. 21 tablet 0   levothyroxine (SYNTHROID) 75 MCG tablet TAKE 1 TABLET BY MOUTH EVERY DAY 30 tablet 1   levothyroxine (SYNTHROID) 75 MCG tablet Take 1 tablet by mouth daily. (Patient not taking: Reported on 07/22/2021)     methocarbamol (ROBAXIN) 500 MG tablet Take 1 tablet  by mouth 3 (three) times daily.     methocarbamol (ROBAXIN) 750 MG tablet 1 tablet (Patient not taking: Reported on 07/22/2021)     methylPREDNISolone (MEDROL DOSEPAK) 4 MG TBPK tablet See admin instructions. (Patient not taking: Reported on 07/22/2021)     montelukast (SINGULAIR) 10 MG tablet Take 10 mg by mouth at bedtime.  (Patient not taking: Reported on 07/22/2021)     montelukast (SINGULAIR) 10 MG tablet 1 tablet     naproxen (NAPROSYN) 500 MG tablet TAKE 1 TABLET TWICE A DAY WITH MEALS FOR TOE PAIN. ALWAYS TAKE WITH FOOD     omeprazole (PRILOSEC) 20 MG capsule Take 20 mg by mouth daily.     OSPHENA 60 MG TABS Take 1 tablet by mouth daily. 30 tablet 10   OVER THE COUNTER MEDICATION Med Name: [employer] Vitamins - +D Boost 2,000 IU 8000 x2; OsteoMatrix 100% +DV of calcium and manganese; ALfalfa Complex; Vita Lea with Iron multivitamin.     pregabalin (LYRICA) 75 MG capsule Take 1 capsule (75 mg total) by mouth 2 (two) times daily. 180 capsule 3  pregabalin (LYRICA) 75 MG capsule Take by mouth. (Patient not taking: Reported on 07/22/2021)     Pseudoephedrine-guaiFENesin 40-400 MG TABS Take 1 tablet by mouth every 12 (twelve) hours. (Patient not taking: Reported on 07/22/2021)     Rimegepant Sulfate (NURTEC) 75 MG TBDP Take by mouth.     rizatriptan (MAXALT) 10 MG tablet 1 at HA onset, may repeat in 2h, max dose 2 in 24h, 2 days/week. (Patient not taking: Reported on 07/22/2021)     terconazole (TERAZOL 3) 80 MG vaginal suppository Place 80 mg vaginally at bedtime as needed.      testosterone (ANDROGEL) 50 MG/5GM (1%) GEL Place 5 g onto the skin daily.     topiramate (TOPAMAX) 100 MG tablet Take 100 mg by mouth at bedtime. (Patient not taking: Reported on 07/22/2021)     traMADol (ULTRAM) 50 MG tablet Take 50 mg by mouth every 6 (six) hours as needed. (Patient not taking: Reported on 07/22/2021)     triamcinolone cream (KENALOG) 0.1 % Apply topically. (Patient not taking: Reported on 07/22/2021)     zonisamide  (ZONEGRAN) 100 MG capsule 1 capsule     No current facility-administered medications on file prior to visit.    ALLERGIES: Allergies  Allergen Reactions   Bee Venom Swelling    histaimine reaction, skin thickens, bruising & swelling   Penicillins Anaphylaxis    hives   Hydrocodone-Acetaminophen Hives    hives   Quinolones     Other reaction(s): Other "Crosses the blood brain barrier and causes me to space out"   Alpha Blocker Quinazolines     FAMILY HISTORY: Family History  Problem Relation Age of Onset   Depression Mother    Thyroid disease Mother    Fibromyalgia Mother    Mitral valve prolapse Father    Heart disease Father    High Cholesterol Father    Breast cancer Sister    Uterine cancer Maternal Grandmother    Alzheimer's disease Maternal Grandfather    Prostate cancer Paternal Grandmother     Objective:  *** General: No acute distress.  Patient appears well-groomed.   Head:  Normocephalic/atraumatic Eyes:  fundi examined but not visualized Neck: supple, no paraspinal tenderness, full range of motion Back: No paraspinal tenderness Heart: regular rate and rhythm Lungs: Clear to auscultation bilaterally. Vascular: No carotid bruits. Neurological Exam: Mental status: alert and oriented to person, place, and time, recent and remote memory intact, fund of knowledge intact, attention and concentration intact, speech fluent and not dysarthric, language intact. Cranial nerves: CN I: not tested CN II: pupils equal, round and reactive to light, visual fields intact CN III, IV, VI:  full range of motion, no nystagmus, no ptosis CN V: facial sensation intact. CN VII: upper and lower face symmetric CN VIII: hearing intact CN IX, X: gag intact, uvula midline CN XI: sternocleidomastoid and trapezius muscles intact CN XII: tongue midline Bulk & Tone: normal, no fasciculations. Motor:  muscle strength 5/5 throughout Sensation:  Pinprick, temperature and vibratory  sensation intact. Deep Tendon Reflexes:  2+ throughout,  toes downgoing.   Finger to nose testing:  Without dysmetria.   Heel to shin:  Without dysmetria.   Gait:  Normal station and stride.  Romberg negative.    Thank you for allowing me to take part in the care of this patient.  Metta Clines, DO  CC: ***

## 2021-07-30 ENCOUNTER — Other Ambulatory Visit: Payer: Self-pay

## 2021-07-30 ENCOUNTER — Other Ambulatory Visit: Payer: Self-pay | Admitting: Neurology

## 2021-07-30 ENCOUNTER — Encounter: Payer: Self-pay | Admitting: Neurology

## 2021-07-30 ENCOUNTER — Ambulatory Visit (INDEPENDENT_AMBULATORY_CARE_PROVIDER_SITE_OTHER): Payer: 59 | Admitting: Neurology

## 2021-07-30 VITALS — BP 105/72 | HR 99 | Resp 18 | Ht 68.0 in | Wt 202.0 lb

## 2021-07-30 DIAGNOSIS — G43009 Migraine without aura, not intractable, without status migrainosus: Secondary | ICD-10-CM

## 2021-07-30 DIAGNOSIS — G43709 Chronic migraine without aura, not intractable, without status migrainosus: Secondary | ICD-10-CM

## 2021-07-30 MED ORDER — ZONISAMIDE 100 MG PO CAPS: 100.0000 mg | ORAL_CAPSULE | Freq: Every day | ORAL | 1 refills | Status: AC

## 2021-07-30 MED ORDER — AIMOVIG 140 MG/ML ~~LOC~~ SOAJ: 140.0000 mg | SUBCUTANEOUS | 11 refills | Status: AC

## 2021-07-30 NOTE — Patient Instructions (Signed)
?  Migraine prevention:  Aimovig '140mg'$  every 28 days; Botox every 3 months, zonisamide '100mg'$  daily ?Migraine rescue:  Nurtec ?Limit use of pain relievers to no more than 2 days out of the week.  These medications include acetaminophen, NSAIDs (ibuprofen/Advil/Motrin, naproxen/Aleve, triptans (Imitrex/sumatriptan), Excedrin, and narcotics.  This will help reduce risk of rebound headaches. ?Be aware of common food triggers: ? - Caffeine:  coffee, black tea, cola, Mt. Dew ? - Chocolate ? - Dairy:  aged cheeses (brie, blue, cheddar, gouda, [place], provolone, [place], Swiss, etc), chocolate milk, buttermilk, sour cream, limit eggs and yogurt ? - Nuts, peanut butter ? - Alcohol ? - Cereals/grains:  FRESH breads (fresh bagels, sourdough, doughnuts), yeast productions ? - Processed/canned/aged/cured meats (pre-packaged deli meats, hotdogs) ? - MSG/glutamate:  soy sauce, flavor enhancer, pickled/preserved/marinated foods ? - Sweeteners:  aspartame (Equal, Nutrasweet).  Sugar and Splenda are okay ? - Vegetables:  legumes (lima beans, lentils, snow peas, fava beans, pinto peans, peas, garbanzo beans), sauerkraut, onions, olives, pickles ? - Fruit:  avocados, bananas, citrus fruit (orange, lemon, grapefruit), mango ? - Other:  Frozen meals, macaroni and cheese ?Routine exercise ?Stay adequately hydrated (aim for 64 oz water daily) ?Keep headache diary ?Maintain proper stress management ?Maintain proper sleep hygiene ?Do not skip meals ?Consider supplements:  magnesium citrate '400mg'$  daily, riboflavin '400mg'$  daily, coenzyme Q10 '100mg'$  three times daily. ? ?

## 2021-07-31 ENCOUNTER — Ambulatory Visit: Payer: 59 | Admitting: Behavioral Health

## 2021-07-31 DIAGNOSIS — F419 Anxiety disorder, unspecified: Secondary | ICD-10-CM

## 2021-07-31 DIAGNOSIS — F331 Major depressive disorder, recurrent, moderate: Secondary | ICD-10-CM

## 2021-07-31 DIAGNOSIS — Z62810 Personal history of physical and sexual abuse in childhood: Secondary | ICD-10-CM
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
## 2021-08-01 NOTE — Progress Notes (Signed)
[facility] forms filled out and faxed over to [phone] ?

## 2021-08-04 ENCOUNTER — Other Ambulatory Visit: Payer: Self-pay | Admitting: Internal Medicine

## 2021-08-05 ENCOUNTER — Other Ambulatory Visit: Payer: Self-pay

## 2021-08-05 DIAGNOSIS — G43009 Migraine without aura, not intractable, without status migrainosus: Secondary | ICD-10-CM

## 2021-08-05 DIAGNOSIS — G43709 Chronic migraine without aura, not intractable, without status migrainosus: Secondary | ICD-10-CM

## 2021-08-05 MED ORDER — BOTOX 200 UNITS IJ SOLR: INTRAMUSCULAR | 4 refills | Status: DC

## 2021-08-05 NOTE — Progress Notes (Signed)
New script sent to [facility] patient advised. ?

## 2021-08-05 NOTE — Progress Notes (Signed)
Per pt she uses [facility] for her Botox. ?Will send in a new script with Dr.Jaffe information. ?Patient. ?Letter recevied from [facility]: PA Denied: Provider OON.  ? ? ?

## 2021-08-07 ENCOUNTER — Other Ambulatory Visit: Payer: Self-pay

## 2021-08-07 ENCOUNTER — Ambulatory Visit (INDEPENDENT_AMBULATORY_CARE_PROVIDER_SITE_OTHER): Payer: 59 | Admitting: Internal Medicine

## 2021-08-07 ENCOUNTER — Encounter: Payer: Self-pay | Admitting: Internal Medicine

## 2021-08-07 VITALS — BP 120/73 | HR 97 | Temp 98.0°F | Ht 68.0 in | Wt 207.9 lb

## 2021-08-07 DIAGNOSIS — M5416 Radiculopathy, lumbar region: Secondary | ICD-10-CM

## 2021-08-07 DIAGNOSIS — M4727 Other spondylosis with radiculopathy, lumbosacral region: Secondary | ICD-10-CM | POA: Diagnosis not present

## 2021-08-07 DIAGNOSIS — M797 Fibromyalgia: Secondary | ICD-10-CM | POA: Diagnosis not present

## 2021-08-07 MED ORDER — PREGABALIN 75 MG PO CAPS: 75.0000 mg | ORAL_CAPSULE | Freq: Two times a day (BID) | ORAL | 3 refills | Status: AC

## 2021-08-07 MED ORDER — DULOXETINE HCL 60 MG PO CPEP: 60.0000 mg | ORAL_CAPSULE | Freq: Every day | ORAL | 2 refills | Status: AC

## 2021-08-07 MED ORDER — BOTOX 200 UNITS IJ SOLR: INTRAMUSCULAR | 4 refills | Status: DC

## 2021-08-07 NOTE — Addendum Note (Signed)
Addended by: Venetia Night on: 08/07/2021 09:29 AM ? ? Modules accepted: Orders ? ?

## 2021-08-07 NOTE — Progress Notes (Signed)
Per letter received from [place] please send script to [facility]. Preferred pharmacy. ?

## 2021-08-07 NOTE — Patient Instructions (Addendum)
Dear Mrs. Tricia Moore, ? ?Thank you for trusting [place] with your care today. ? ?We have placed a referral to pain clinic to help with your back pain.  ?I have sent refills for your Cymbalta and Lyrica. These two medications will be very important to control your pain.  ?

## 2021-08-07 NOTE — Progress Notes (Signed)
? ?  CC: back pain ? ?HPI:[name] is a 51 y.o. female who presents for evaluation of back pain. Please see individual problem based A/P for details. ? ?Depression, PHQ-9: ?Based on the patients  ?[facility] Office Visit from 07/22/2021 in [place]  ?PHQ-9 Total Score 0  ? ?  ? score we have . ? ?Past Medical History:  ?Diagnosis Date  ? Allergic rhinitis   ? Depression   ? Migraines   ? Ovarian cyst 2004  ? Thyroid disease   ? hypothyroid  ? Uterine cancer ([place]) 2004  ? ?Review of Systems:   ?Review of Systems  ?Constitutional: Negative.   ?Respiratory: Negative.    ?Cardiovascular: Negative.   ?Gastrointestinal: Negative.   ?Genitourinary: Negative.   ?Musculoskeletal:  Positive for back pain.  ?Skin: Negative.    ? ?Physical Exam: ?There were no vitals filed for this visit. ? ? ?General: alert and oriented ?HEENT: Conjunctiva nl , antiicteric sclerae, moist mucous membranes, no exudate or erythema ?Cardiovascular: Normal rate, regular rhythm.  No murmurs, rubs, or gallops ?Pulmonary : Equal breath sounds, No wheezes, rales, or rhonchi ?Abdominal: soft, nontender,  bowel sounds present ?Ext: No edema in lower extremities, no tenderness to palpation of lower extremities.  ?Neuro: sensation intact to gross touch, 5/5 strength  ? ?Assessment & Plan:  ? ?See Encounters Tab for problem based charting. ? ?Patient discussed with Dr. Evette Doffing ? ?

## 2021-08-08 NOTE — Assessment & Plan Note (Addendum)
Patient reporting continued back pain. Previously referred to [facility] as she reported following with [facility] in [place] for spinal injections, however, was denied by insurance. Ongoing pain which she describes as constant in nature. Endorses chronic numbness and tingling in right foot for past year.  She reports she takes pregabalin, duloxetine, methocarbamol, and tramadol. Has also been using advil and tylenol for pain but without relief. Pain 9/10 and reports it is impacting her daily life.  ?Given chronic nature of her pain, likely will be difficult to fully treat. Discussed with her that treatment would likely not fully resolve her pain. Also stressed the importance of consistent usage of duloxetine and lyrica. Referral placed to pain clinic for further assistance and instructed her to follow up with them. ? ?

## 2021-08-09 ENCOUNTER — Encounter: Payer: Self-pay | Admitting: Internal Medicine

## 2021-08-10 IMAGING — DX DG FACIAL BONES COMPLETE 3+V
5 series · 5 of 5 positions shown · non-contrast
Comparison: None.

CLINICAL DATA: Blunt trauma to the left orbit, initial encounter

EXAM:
FACIAL BONES COMPLETE 3+V

[skull pa]
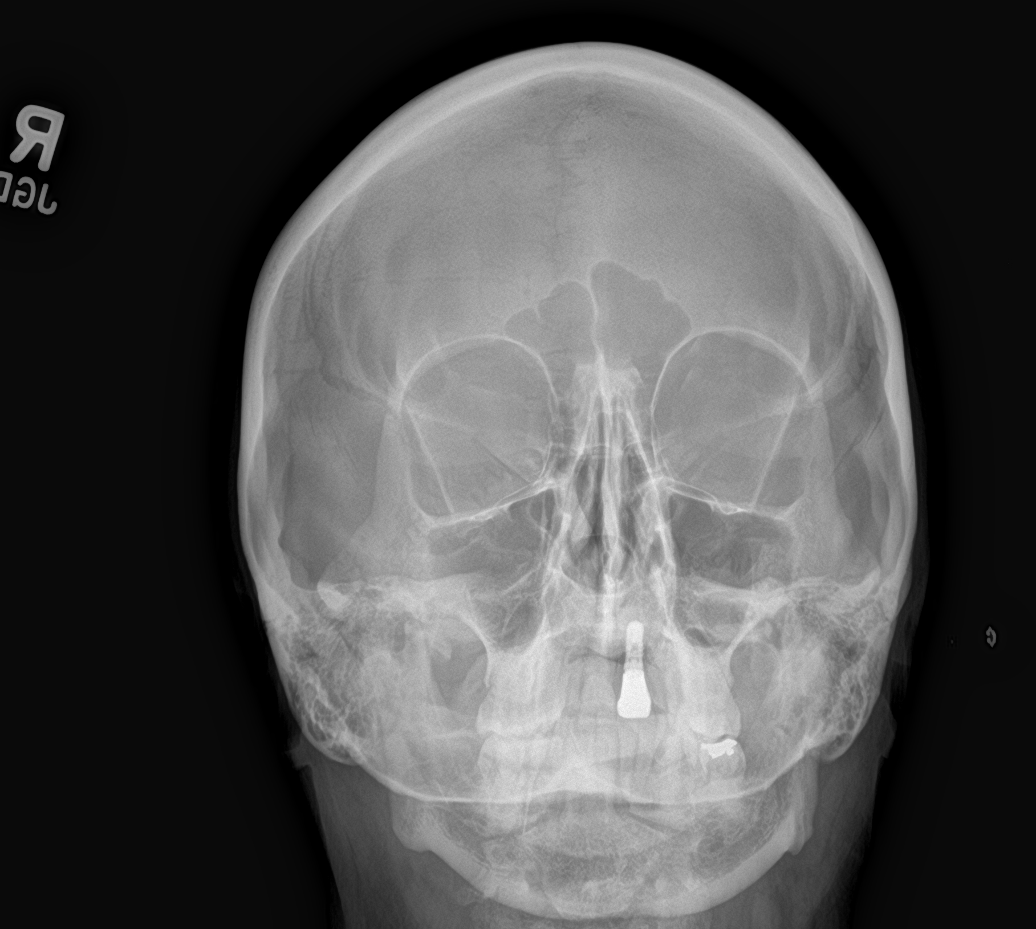

[skull towns]
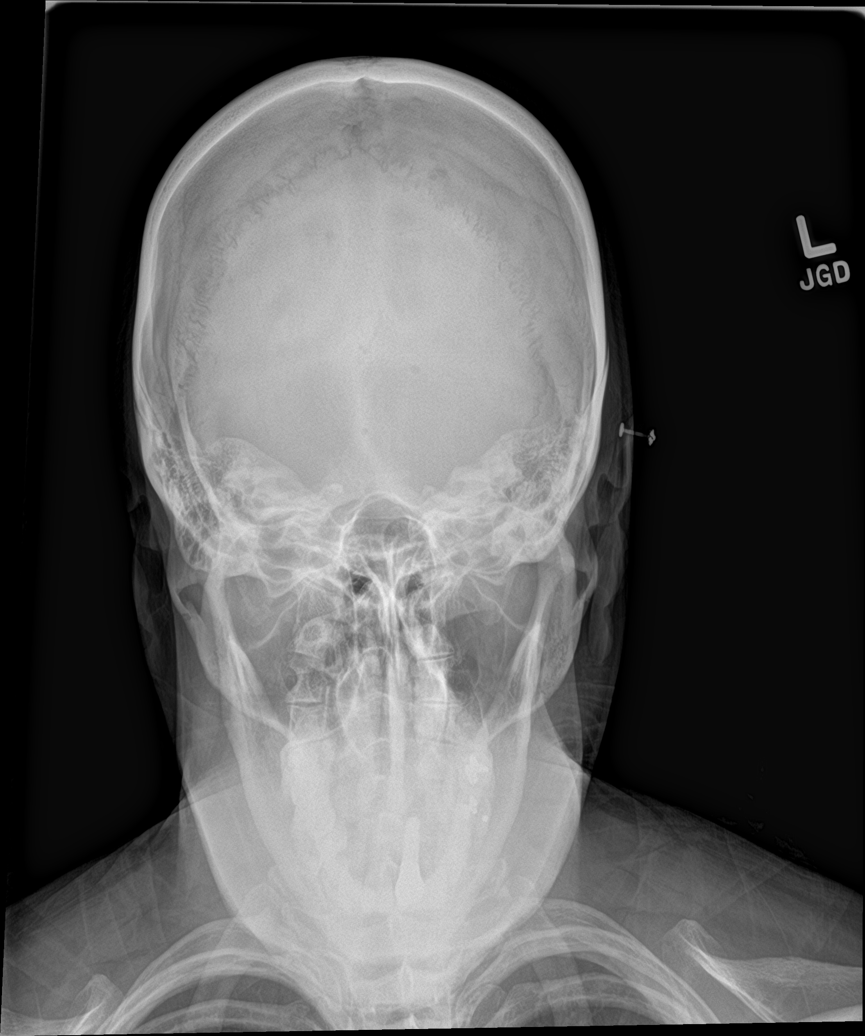

[skull lat]
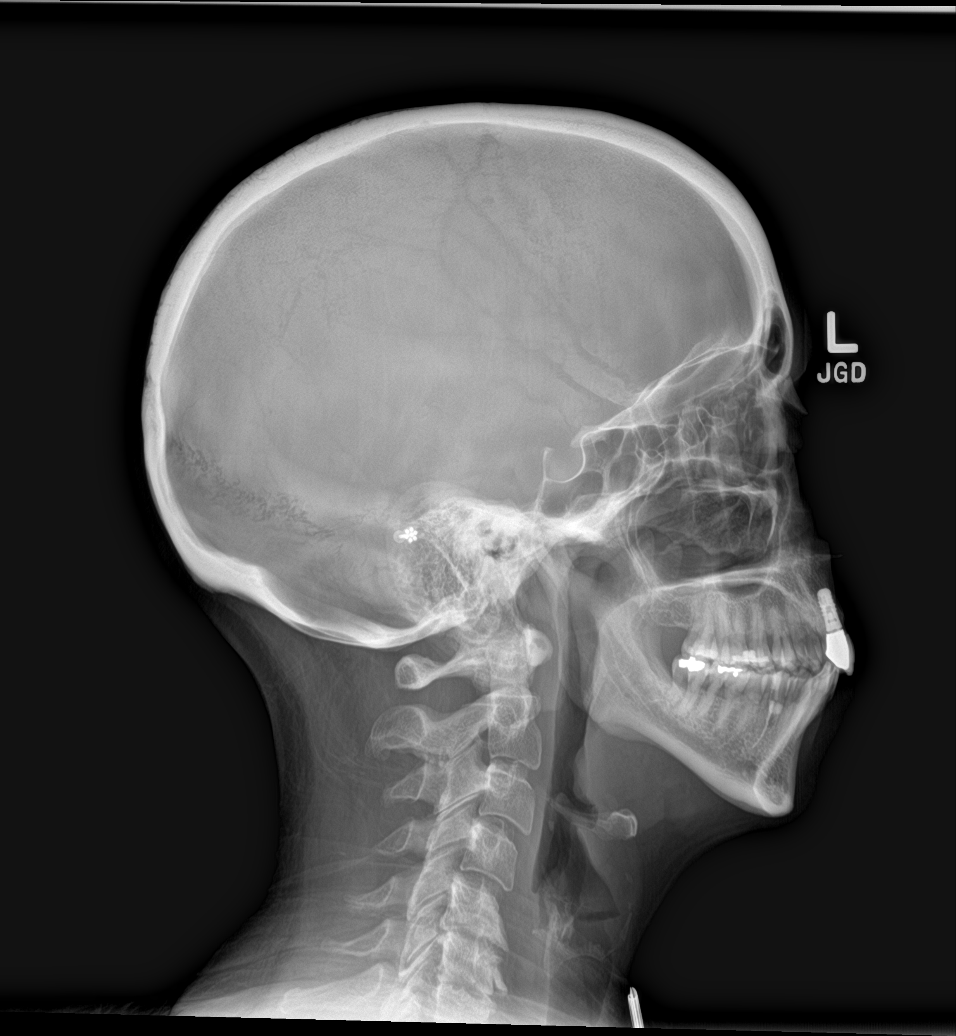

[skull waters]
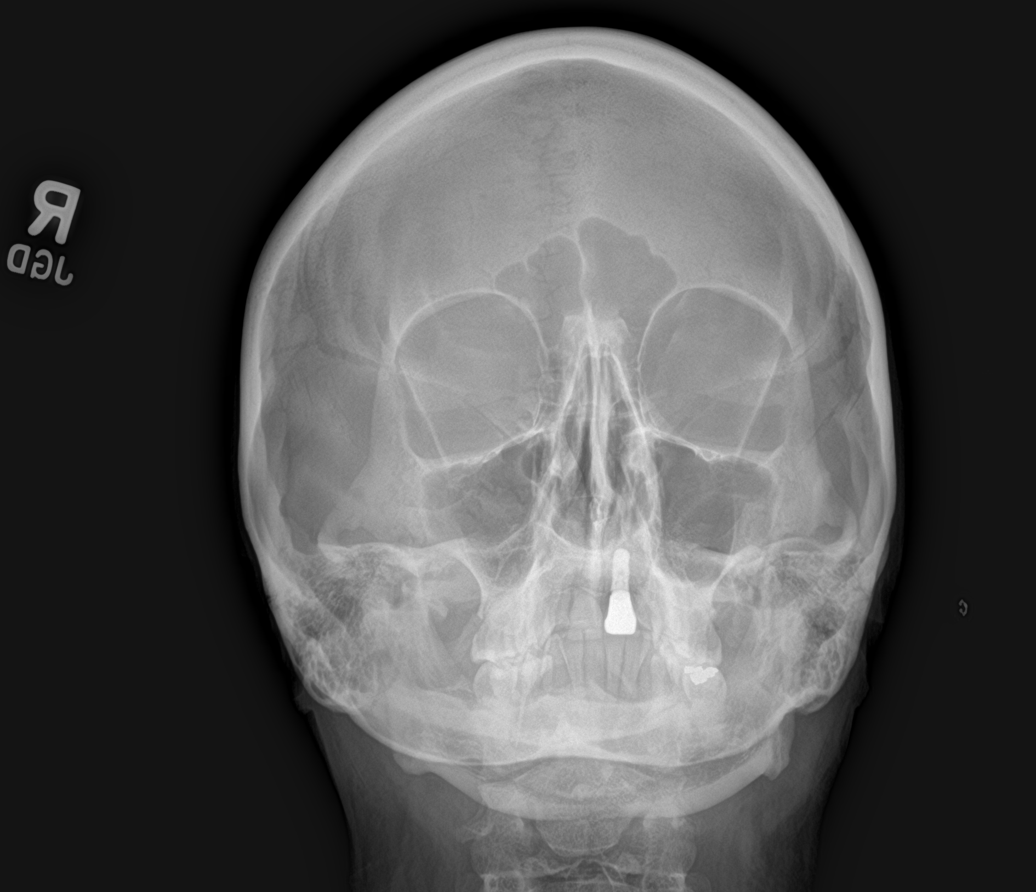

[skull smv]
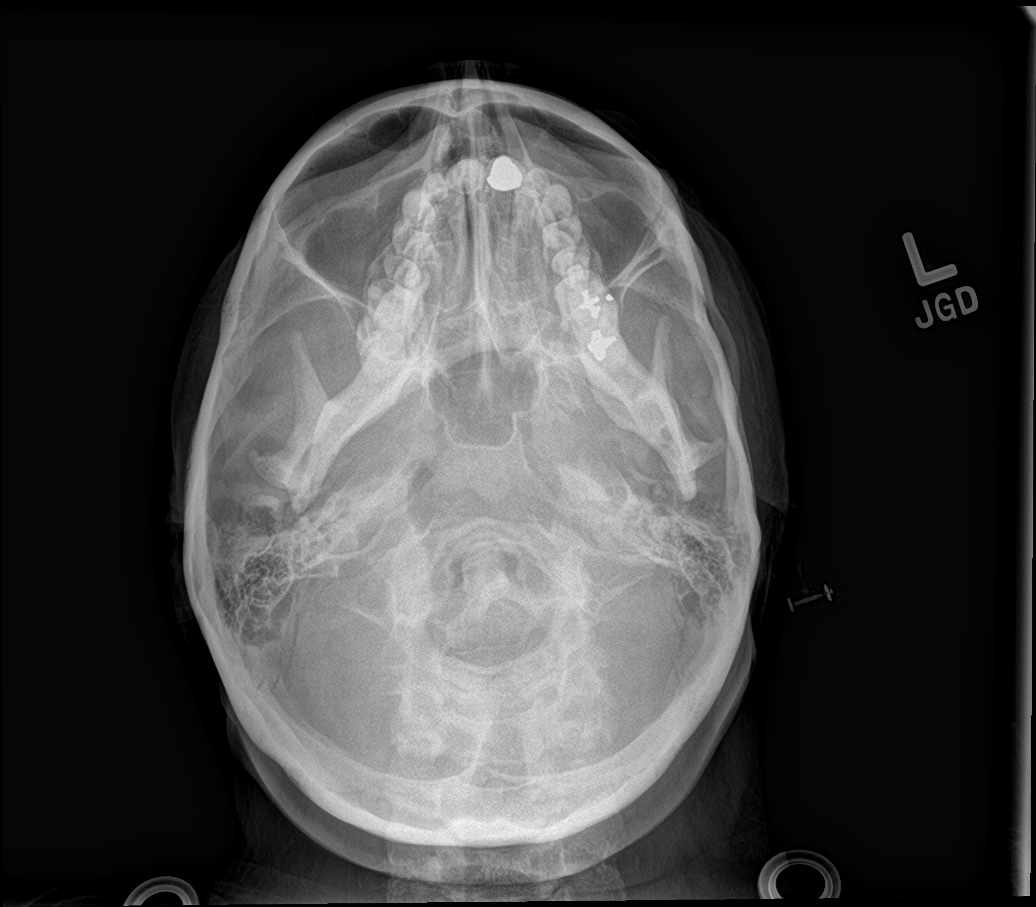

[5 of 5 positions shown; findings below may reference images not displayed]

FINDINGS: There is no evidence of fracture or other significant bone
abnormality. No orbital emphysema or sinus air-fluid levels are
seen.
IMPRESSION: No acute fracture is noted.

## 2021-08-12 ENCOUNTER — Ambulatory Visit (INDEPENDENT_AMBULATORY_CARE_PROVIDER_SITE_OTHER): Payer: 59

## 2021-08-12 ENCOUNTER — Ambulatory Visit (INDEPENDENT_AMBULATORY_CARE_PROVIDER_SITE_OTHER): Payer: 59 | Admitting: Podiatry

## 2021-08-12 ENCOUNTER — Other Ambulatory Visit: Payer: Self-pay

## 2021-08-12 ENCOUNTER — Encounter: Payer: Self-pay | Admitting: Podiatry

## 2021-08-12 DIAGNOSIS — Z9889 Other specified postprocedural states: Secondary | ICD-10-CM | POA: Diagnosis not present

## 2021-08-12 NOTE — Progress Notes (Signed)
Subjective:  ? ?Patient ID: Tricia Moore, female   DOB: 50 y.o.   MRN: [id number]  ? ?HPI ?Patient states she is doing very well with her right foot and very pleased and having minimal discomfort ? ? ?ROS ? ? ?   ?Objective:  ?Physical Exam  ?Neurovascular status intact negative Bevelyn Buckles' sign noted right foot healing well wound edges well coapted good range of motion no pain ? ?   ?Assessment:  ?Doing well post osteotomy right first metatarsal with symptomatic left foot but wants to wait till the fall to fix  ? ?   ?Plan:  ?H&P reviewed condition and patient may increase activity levels with stocking dispensed.  Encourage range of motion and reappoint as needed but this should heal uneventfully and understands will take approximate another 3 to 4 months to heal completely ? ?X-rays indicate that the osteotomy is healing well fixation in place good alignment of the joint surface ?   ? ? ?

## 2021-08-12 NOTE — Progress Notes (Signed)
Internal Medicine Clinic Attending ° °Case discussed with Dr. Gawaluck  At the time of the visit.  We reviewed the resident’s history and exam and pertinent patient test results.  I agree with the assessment, diagnosis, and plan of care documented in the resident’s note.  °

## 2021-08-12 NOTE — Progress Notes (Signed)
Botox sent to [facility] on 08/07/21, Per rep patient is to call and set up account to get started with Botox saving information. ?Per Pt she do not have that information.  ?Advised pt on Botox saving and phone number given. [phone]-Botox. ? ?And to make sure she call her insurance to see if Dr.Jaffe is INN for OV.  ?

## 2021-08-14 ENCOUNTER — Ambulatory Visit: Payer: 59 | Admitting: Behavioral Health

## 2021-08-14 DIAGNOSIS — F419 Anxiety disorder, unspecified: Secondary | ICD-10-CM

## 2021-08-14 DIAGNOSIS — F331 Major depressive disorder, recurrent, moderate: Secondary | ICD-10-CM

## 2021-08-14 NOTE — BH Specialist Note (Signed)
Integrated Behavioral Health via Telemedicine Visit ? ?08/14/2021 ?Tricia Moore ?[id number] ? ?Number of [place] Clinician visits: 3 ?Session Start time: 0900 ?Session End time: 1975 ?Total time in minutes: 45 min ? ?Referring Provider: Dr. Lajuan Lines, MD ?Patient/Family location: Pt is home in private ?[facility] Provider location: [facility] Office ?All persons participating in visit: Pt & Clinician ?Types of Service: Individual psychotherapy ? ?I connected with Tricia Moore and/or Tricia Moore's  self  via  Telephone or Video Enabled Telemedicine Application  (Video is Caregility application) and verified that I am speaking with the correct person using two identifiers. Discussed confidentiality: Yes  ? ?I discussed the limitations of telemedicine and the availability of in person appointments.  Discussed there is a possibility of technology failure and discussed alternative modes of communication if that failure occurs. ? ?I discussed that engaging in this telemedicine visit, they consent to the provision of behavioral healthcare and the services will be billed under their insurance. ? ?Patient and/or legal guardian expressed understanding and consented to Telemedicine visit: Yes  ? ?Presenting Concerns: ?Patient and/or family reports the following symptoms/concerns: Pt is examining her life story from childhood onwards. 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 ?Duration of problem: months; Severity of problem: mild ? ?Patient and/or Family's Strengths/Protective Factors: ?Social connections, Social and Patent attorney, Concrete supports in place (healthy food, safe environments, etc.), Sense of purpose, Physical Health (exercise, healthy diet, medication compliance, etc.), and Caregiver has knowledge of parenting & child development ? ?Goals Addressed: ?Patient will: ? Reduce symptoms of: anxiety and depression  ? Increase knowledge  and/or ability of: coping skills and healthy habits  ? Demonstrate ability to: Increase healthy adjustment to current life circumstances ? ?Progress towards Goals: ?Ongoing ? ?Interventions: ?Interventions utilized:  Supportive Counseling ?Standardized Assessments completed:  screeners prn ? ?Patient and/or Family Response: Pt receptive to call today & requests future visits ? ?Assessment: ?Patient currently experiencing reduction in anx/dep due to her personal journey. Pt is gaining from her Life Coaching sessions & she is steering herself into new endeavors bc of this growth. ? ?Patient may benefit from exploration of the CPE Program @ [facility] to enrich her own journey. ? ?Plan: ?Follow up with behavioral health clinician on : 2-3 wks for 30 telehealth ?Behavioral recommendations: Explore CPE & determine if it is a fit for you. ?Referral(s): [place] (In Clinic) ? ?I discussed the assessment and treatment plan with the patient and/or parent/guardian. They were provided an opportunity to ask questions and all were answered. They agreed with the plan and demonstrated an understanding of the instructions. ?  ?They were advised to call back or seek an in-person evaluation if the symptoms worsen or if the condition fails to improve as anticipated. ? ?Donnetta Hutching, LMFT ?

## 2021-08-19 NOTE — Progress Notes (Signed)
Pt advised of Approval of Botox, Script sent to [place] per Approval/. ? ?Pt to call and set up account, I will call and check to see if I can set up delivery. ? ?Per Pt she will be out of town on a work trip for [employer] next botox day 09/06/21. Pt aware to call me when she gets in town so we can see when she can be scheduled for botox.  ?

## 2021-08-22 ENCOUNTER — Inpatient Hospital Stay: Payer: 59 | Attending: Genetic Counselor | Admitting: Genetic Counselor

## 2021-08-22 ENCOUNTER — Other Ambulatory Visit: Payer: Self-pay

## 2021-08-22 ENCOUNTER — Inpatient Hospital Stay: Payer: 59

## 2021-08-22 ENCOUNTER — Encounter: Payer: Self-pay | Admitting: Genetic Counselor

## 2021-08-22 DIAGNOSIS — C55 Malignant neoplasm of uterus, part unspecified: Secondary | ICD-10-CM

## 2021-08-22 DIAGNOSIS — Z803 Family history of malignant neoplasm of breast: Secondary | ICD-10-CM | POA: Diagnosis not present

## 2021-08-22 DIAGNOSIS — Z8049 Family history of malignant neoplasm of other genital organs: Secondary | ICD-10-CM | POA: Insufficient documentation

## 2021-08-22 LAB — GENETIC SCREENING ORDER

## 2021-08-22 NOTE — Progress Notes (Addendum)
REFERRING PROVIDER: ?Griffin Basil, MD ? ?PRIMARY PROVIDER:  ?Delene Ruffini, MD ? ?PRIMARY REASON FOR VISIT:  ?1. Malignant neoplasm of uterus, unspecified site [facility])   ?2. Family history of breast cancer   ?3. Family history of uterine cancer   ? ? ?HISTORY OF PRESENT ILLNESS:   ?Ms. Davidson-Mayer, a 51 y.o. female, was seen for a [place] cancer genetics consultation at the request of Dr. Elgie Congo due to a personal and family history of cancer.  Ms. Sandridge presents to clinic today to discuss the possibility of a hereditary predisposition to cancer, to discuss genetic testing, and to further clarify her future cancer risks, as well as potential cancer risks for family members.  ? ?Ms. Davidson-Mayer was diagnosed with uterine cancer at age 31-32. ? ?CANCER HISTORY:  ?Oncology History  ? No history exists.  ? ? ?RISK FACTORS:  ?Menarche: [age] ?First live birth at age 67.  ?OCP use for approximately 10+ years.  ?Ovaries intact: one ovary is intact ?Uterus intact: no.  ?Colonoscopy: yes; normal. ?Mammogram within the last year: yes. ?Any excessive radiation exposure in the past: no ? ?Past Medical History:  ?Diagnosis Date  ? Allergic rhinitis   ? Depression   ? Migraines   ? Ovarian cyst 2004  ? Thyroid disease   ? hypothyroid  ? Uterine cancer [facility]) 2004  ? ? ?Past Surgical History:  ?Procedure Laterality Date  ? ABDOMINAL HYSTERECTOMY    ? APPENDECTOMY    ? CESAREAN SECTION    ? NASAL SINUS SURGERY    ? TONSILLECTOMY    ? ? ?Social History  ? ?Socioeconomic History  ? Marital status: Divorced  ?  Spouse name: Not on file  ? Number of children: Not on file  ? Years of education: Not on file  ? Highest education level: Not on file  ?Occupational History  ? Not on file  ?Tobacco Use  ? Smoking status: Never  ? Smokeless tobacco: Never  ?Substance and Sexual Activity  ? Alcohol use: Yes  ?  Comment: occ  ? Drug use: Not Currently  ? Sexual activity: Yes  ?  Birth control/protection: Surgical  ?Other  Topics Concern  ? Not on file  ?Social History Narrative  ? Right handed  ? Drinks caffeine prn  ? One story home  ? ?Social Determinants of Health  ? ?Financial Resource Strain: Not on file  ?Food Insecurity: Not on file  ?Transportation Needs: Not on file  ?Physical Activity: Not on file  ?Stress: Not on file  ?Social Connections: Not on file  ?  ? ?FAMILY HISTORY:  ?We obtained a detailed, 4-generation family history.  Significant diagnoses are listed below: ?Family History  ?Problem Relation Age of Onset  ? Depression Mother   ? Thyroid cancer Mother 25  ?     thyroidectomy  ? Fibromyalgia Mother   ? Mitral valve prolapse Father   ? Heart disease Father   ? High Cholesterol Father   ? Breast cancer Sister 50  ?     Negative hereditary cancer genetic testing  ? Breast cancer Maternal Aunt   ?     dx. 57s  ? Breast cancer Paternal Aunt   ?     dx. 60s, half-aunt  ? Uterine cancer Maternal Grandmother   ?     dx. 31s  ? Alzheimer's disease Maternal Grandfather   ? Prostate cancer Paternal Grandmother   ?     dx. 54s  ? ? ? ?  Ms. Gaertner sister was diagnosed with breast cancer at age 32 and reportedly had negative hereditary cancer genetic testing (70+ genes). Her mother was diagnosed with thyroid cancer at age 41 and had a thyroidectomy. Her maternal aunt was diagnosed with breast cancer in her 66s. Her maternal grandmother was diagnosed with uterine cancer in her 58s and died at age 65. Her maternal great grandmother (grandmother's mother) was diagnosed with uterine cancer at an unknown age, she is deceased. Ms. Mokry paternal half-aunt was diagnosed with breast cancer in her 35s. There is no reported Ashkenazi Jewish ancestry.  ? ?GENETIC COUNSELING ASSESSMENT: Ms. Yarbough is a 51 y.o. female with a personal and family history of cancer which is somewhat suggestive of a hereditary predisposition to cancer given her young age at diagnosis. We, therefore, discussed and recommended the  following at today's visit.  ? ?DISCUSSION: We discussed that 5 - 10% of cancer is hereditary, with most cases of uterine cancer associated with Lynch Syndrome.  There are other genes that can be associated with hereditary uterine and breast cancer syndromes.  We discussed that testing is beneficial for several reasons including knowing how to follow individuals after completing their treatment and understanding if other family members could be at an increased risk for cancer.  ? ?We reviewed the characteristics, features and inheritance patterns of hereditary cancer syndromes. We also discussed genetic testing, including the appropriate family members to test, the process of testing, insurance coverage and turn-around-time for results. We discussed the implications of a negative, positive, carrier and/or variant of uncertain significant result. We recommended Ms. Davidson-Mayer pursue genetic testing for a panel that includes genes associated with uterine and breast cancer.  ? ?Ms. Davidson-Mayer  was offered a common hereditary cancer panel (47 genes) and an expanded pan-cancer panel (77 genes). Ms. Scalisi was informed of the benefits and limitations of each panel, including that expanded pan-cancer panels contain genes that do not have clear management guidelines at this point in time.  We also discussed that as the number of genes included on a panel increases, the chances of variants of uncertain significance increases.  After considering the benefits and limitations of each gene panel, Ms. Davidson-Mayer elected to have [facility] CancerNext-Expanded Panel. ? ?The CancerNext-Expanded gene panel offered by [facility] and includes sequencing, rearrangement, and RNA analysis for the following 77 genes: AIP, ALK, APC, ATM, AXIN2, BAP1, BARD1, BLM, BMPR1A, BRCA1, BRCA2, BRIP1, CDC73, CDH1, CDK4, CDKN1B, CDKN2A, CHEK2, CTNNA1, DICER1, FANCC, FH, FLCN, GALNT12, KIF1B, LZTR1, MAX, MEN1, MET, MLH1, MSH2, MSH3,  MSH6, MUTYH, NBN, NF1, NF2, NTHL1, PALB2, PHOX2B, PMS2, POT1, PRKAR1A, PTCH1, PTEN, RAD51C, RAD51D, RB1, RECQL, RET, SDHA, SDHAF2, SDHB, SDHC, SDHD, SMAD4, SMARCA4, SMARCB1, SMARCE1, STK11, SUFU, TMEM127, TP53, TSC1, TSC2, VHL and XRCC2 (sequencing and deletion/duplication); EGFR, EGLN1, HOXB13, KIT, MITF, PDGFRA, POLD1, and POLE (sequencing only); EPCAM and GREM1 (deletion/duplication only).  ? ?Based on Ms. Davidson-Mayer's personal and family history of cancer, she meets medical criteria for genetic testing. Despite that she meets criteria, she may still have an out of pocket cost. We discussed that if her out of pocket cost for testing is over $100, the laboratory will call and confirm whether she wants to proceed with testing.  If the out of pocket cost of testing is less than $100 she will be billed by the genetic testing laboratory.  ? ?PLAN: After considering the risks, benefits, and limitations, Ms. Davidson-Mayer provided informed consent to pursue genetic testing and the blood sample was sent to  [facility] for analysis of the CancerNext-Expanded Panel. Results should be available within approximately 2-3 weeks' time, at which point they will be disclosed by telephone to Ms. Davidson-Mayer, as will any additional recommendations warranted by these results. Ms. Lorence will receive a summary of her genetic counseling visit and a copy of her results once available. This information will also be available in Epic.  ? ?Ms. Davidson-Mayer's questions were answered to her satisfaction today. Our contact information was provided should additional questions or concerns arise. Thank you for the referral and allowing [place] to share in the care of your patient.  ? ?[name], MS, [place] ?Genetic Counselor ?[email or web address] ?(P) [phone] ? ?The patient was seen for a total of 40 minutes in face-to-face genetic counseling.  The patient brought her sister. Drs. Lindi Adie and/or Burr Medico were  available to discuss this case as needed.  ? ?_______________________________________________________________________ ?For Office Staff:  ?Number of people involved in session: 2 ?Was an Intern/ student involved

## 2021-08-28 ENCOUNTER — Ambulatory Visit: Payer: 59 | Admitting: Behavioral Health

## 2021-08-28 DIAGNOSIS — F419 Anxiety disorder, unspecified: Secondary | ICD-10-CM

## 2021-08-28 DIAGNOSIS — F330 Major depressive disorder, recurrent, mild: Secondary | ICD-10-CM

## 2021-08-28 NOTE — BH Specialist Note (Signed)
Integrated Behavioral Health via Telemedicine Visit ? ?08/28/2021 ?Tricia Moore ?[id number] ? ?Number of [place] Clinician visits: 4 ?Session Start time: 0900 ?Session End time: 0930 ?Total time in minutes: 30 min ? ?Referring Provider: Dr. Lajuan Lines, MD ?Patient/Family location: Pt is home in private ?[facility] Provider location: Working remotely ?All persons participating in visit: Pt & Clinician ?Types of Service: Individual psychotherapy ? ?I connected with Tricia Moore and/or Tricia Moore's  self  via  Telephone or Video Enabled Telemedicine Application  (Video is Caregility application) and verified that I am speaking with the correct person using two identifiers. Discussed confidentiality: Yes  ? ?I discussed the limitations of telemedicine and the availability of in person appointments.  Discussed there is a possibility of technology failure and discussed alternative modes of communication if that failure occurs. ? ?I discussed that engaging in this telemedicine visit, they consent to the provision of behavioral healthcare and the services will be billed under their insurance. ? ?Patient and/or legal guardian expressed understanding and consented to Telemedicine visit: Yes  ? ?Presenting Concerns: ?Patient and/or family reports the following symptoms/concerns: cont'd reduction in Sx of anx/dep ?Duration of problem: years; Severity of problem: mild ? ?Patient and/or Family's Strengths/Protective Factors: ?Social and Emotional competence, Concrete supports in place (healthy food, safe environments, etc.), Sense of purpose, and Physical Health (exercise, healthy diet, medication compliance, etc.) ? ?Goals Addressed: ?Patient will: ? Reduce symptoms of: anxiety, depression, and stress  ? Increase knowledge and/or ability of: coping skills, healthy habits, and stress reduction  ? Demonstrate ability to: Increase healthy adjustment to current life circumstances  and Begin healthy grieving over loss ? ?Progress towards Goals: ?Ongoing ? ?Interventions: ?Interventions utilized:  Solution-Focused Strategies and Supportive Counseling ?Standardized Assessments completed: Not Needed ? ?Patient and/or Family Response: Pt receptive to call today & discussion of termination initiated. ? ?Assessment: ?Patient currently experiencing a new revival of her self-esteem. Pt has begun a new journey to share the gifts she never realized she possessed. ? ?Pt has initiated f/u with her Doristine Bosworth on CPE, f/u with a [employer], & a call with her Life Coach whose Company Owner want to ZOOM with her soon. ? ?Patient may benefit from cont'd Cslg. ? ?Plan: ?Follow up with behavioral health clinician on : 2-3wks for last or next to last session. ?Behavioral recommendations: None today ?Referral(s): [place] (In Clinic) ? ?I discussed the assessment and treatment plan with the patient and/or parent/guardian. They were provided an opportunity to ask questions and all were answered. They agreed with the plan and demonstrated an understanding of the instructions. ?  ?They were advised to call back or seek an in-person evaluation if the symptoms worsen or if the condition fails to improve as anticipated. ? ?Donnetta Hutching, LMFT ?

## 2021-08-30 ENCOUNTER — Other Ambulatory Visit: Payer: Self-pay | Admitting: Internal Medicine

## 2021-08-30 MED ORDER — BOTOX 200 UNITS IJ SOLR: INTRAMUSCULAR | 4 refills | Status: DC

## 2021-08-30 MED ORDER — BOTOX 200 UNITS IJ SOLR
INTRAMUSCULAR | 4 refills | Status: AC
Start: 2021-08-30 — End: ?

## 2021-08-30 NOTE — Addendum Note (Signed)
Addended by: Venetia Night on: 08/30/2021 04:06 PM ? ? Modules accepted: Orders ? ?

## 2021-08-30 NOTE — Progress Notes (Signed)
[facility], and [facility]. ? ? ?Botox script sent to [facility]. Will call and see if they received the script and let the patient know.  ? ?Telephone call to [facility], Order for Botox. ?Verbal script given as well  ?Botox 200 units #1 with 4 refills ?Inject 155 units IM into multiple site in the face,neck and head once every 90 days. ?

## 2021-09-11 ENCOUNTER — Ambulatory Visit: Payer: 59 | Admitting: Behavioral Health

## 2021-09-16 ENCOUNTER — Other Ambulatory Visit: Payer: Self-pay | Admitting: Obstetrics and Gynecology

## 2021-09-16 DIAGNOSIS — N898 Other specified noninflammatory disorders of vagina: Secondary | ICD-10-CM

## 2021-09-17 ENCOUNTER — Encounter: Payer: Self-pay | Admitting: Neurology

## 2021-09-17 ENCOUNTER — Telehealth: Payer: Self-pay

## 2021-09-17 NOTE — Telephone Encounter (Signed)
Tried calling for patient to contact [facility] so Botox can be delivery ?

## 2021-09-18 ENCOUNTER — Telehealth: Payer: Self-pay | Admitting: Genetic Counselor

## 2021-09-18 ENCOUNTER — Ambulatory Visit: Payer: Self-pay | Admitting: Genetic Counselor

## 2021-09-18 ENCOUNTER — Encounter: Payer: Self-pay | Admitting: Genetic Counselor

## 2021-09-18 DIAGNOSIS — Z1589 Genetic susceptibility to other disease: Secondary | ICD-10-CM | POA: Insufficient documentation

## 2021-09-18 DIAGNOSIS — Z1379 Encounter for other screening for genetic and chromosomal anomalies: Secondary | ICD-10-CM

## 2021-09-18 NOTE — Progress Notes (Signed)
HPI:   ?Tricia Moore was previously seen in the [place] clinic due to a personal and family history of cancer and concerns regarding a hereditary predisposition to cancer. Please refer to our prior cancer genetics clinic note for more information regarding our discussion, assessment and recommendations, at the time. Tricia Moore recent genetic test results were disclosed to her, as were recommendations warranted by these results. These results and recommendations are discussed in more detail below. ? ?CANCER HISTORY:  ? Tricia Moore was diagnosed with uterine cancer at age 51-32. ? ? ?FAMILY HISTORY:  ?We obtained a detailed, 4-generation family history.  Significant diagnoses are listed below: ?Family History  ?Problem Relation Age of Onset  ? Depression Mother   ? Thyroid cancer Mother 40  ?     thyroidectomy  ? Fibromyalgia Mother   ? Mitral valve prolapse Father   ? Heart disease Father   ? High Cholesterol Father   ? Breast cancer Sister 77  ?     Negative hereditary cancer genetic testing  ? Breast cancer Maternal Aunt   ?     dx. 37s  ? Breast cancer Paternal Aunt   ?     dx. 60s, half-aunt  ? Uterine cancer Maternal Grandmother   ?     dx. 55s  ? Alzheimer's disease Maternal Grandfather   ? Prostate cancer Paternal Grandmother   ?     dx. 3s  ? ? ?  ? ?Tricia Moore's sister was diagnosed with breast cancer at age 61 and reportedly had negative hereditary cancer genetic testing (70+ genes). Her mother was diagnosed with thyroid cancer at age 5 and had a thyroidectomy. Her maternal aunt was diagnosed with breast cancer in her 64s. Her maternal grandmother was diagnosed with uterine cancer in her 22s and died at age 60. Her maternal great grandmother (grandmother's mother) was diagnosed with uterine cancer at an unknown age, she is deceased. Tricia Moore paternal half-aunt was diagnosed with breast cancer in her 23s. There is no reported Ashkenazi Jewish  ancestry.  ? 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 ? ?The test report has been scanned into EPIC and is located under the Molecular Pathology section of the Results Review tab.  A portion of the result report is included below for reference. Genetic testing reported out on 09/17/2021.   ? ?Genetic testing identified a variant of uncertain significance (VUS) in the PTCH1 gene called p.V1131A.  At this time, it is unknown if this variant is associated with an increased risk for cancer or if it is benign, but most uncertain variants are reclassified to benign. It should not be used to make medical management decisions. With time, we suspect the laboratory will determine the significance of this variant, if any. If the laboratory reclassifies this variant, we will attempt to contact Tricia Moore to discuss it further. We do not recommend familial testing for the variant of uncertain significance (VUS). ? ? ? ? ? ?Clinical Information: ?Changes in the MUTYH gene are associated with MUTYH-associated Polyposis syndrome (MAP). MAP is a hereditary cancer condition in which patients have high risks for colorectal cancer due to a large number of adenomatous polyps in the gastrointestinal system. MAP is unique among the hereditary cancer syndromes in that it is a "recessive" condition. Most hereditary cancer conditions are "dominant?,  meaning the condition is present in patients with a mutation in one copy of the gene. Patients have MAP only if there are mutations in both of their copies of the MUTYH gene. 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 It is estimated that 1% to 2% of the population has a mutation in one copy of the MUTYH gene.  These individuals do not have MAP.  ? ?There is conflicting information regarding colon cancer risks for MUTYH carriers. Some studies have reported a 2-3 fold increased risk-- more so in individuals with a family history of colon cancer. Other studies have found no substantial evidence supporting MUTYH carriers having increased risks of colon cancer. At this time, the [employer]  recommends screening for MUTYH carriers based on family history of colon cancer. ? ?Research is continuing to help learn more about the cancers associated with MUTYH pathogenic variants and what the exact risks are to develop these cancers. ? ? ?Management Recommendations for individuals with a single MUTYH pathogenic variant: ? ?Colon Cancer Screening: ?If an individual has a first-degree relative with colorectal cancer, screening should begin at age 28 or 19 years prior to the relative?s age at diagnosis, whichever comes first and repeat colonoscopies every 5 years.   ?If an individual has no first-degree relatives with colorectal cancer, data is unclear if specialized screening is warranted. We advise these patients to follow their gastroenterologists recommendations. ? ?This information is based on current understanding of the gene and may change in the future. ?  ?Implications for Family Members: ?Tricia Moore?s first degree relatives are at a 50% risk for also having inherited the MUTYH mutation. Family members may consider genetic testing for this familial pathogenic variant. As there are generally no childhood cancer risks associated with pathogenic variants in the MUTYH gene, individuals in the family are not recommended to have testing until they reach at least 51 years of age.  ? ?They may contact our office at [phone] for more information or to schedule an appointment.  Complimentary testing for the familial variant is available for 90 days from the report date.  Family members who live  outside of the area are encouraged to find a genetic counselor in their area by visiting: [email or web address].  ? ?Additional Information: ?Even though a pathogenic variant was not identified that explains her personal and family history of cancer, possible explanations may include: ?There may be no hereditary risk for cancer in the family. The cancers in Tricia Moore and/or her family may be due to other genetic or environmental factors. ?There may be a gene mutation in one of these genes that current testing methods cannot detect, but that chance is small. ?There could be another gene that has not yet been discovered, or that we have not yet tested, that is responsible for the cancer diagnoses in the family.  ?It is also possible there is a hereditary cause for the cancer in the family that Tricia Moore did not inherit. ? ?Therefore, it is important to remain in touch with cancer genetics in the future so that we can continue to offer Tricia Moore the most up to date genetic testing.  ? ?ADDITIONAL GENETIC TESTING:  ?We discussed with Tricia Moore that her genetic testing was fairly extensive.  If there are genes identified to increase cancer risk that can be analyzed in the future, we would be happy to discuss and coordinate this testing at  that time.   ? ?CANCER SCREENING RECOMMENDATIONS:  ?Tricia Moore's test result is considered negative (normal).  This means that we have not identified a hereditary cause for her personal and family history of cancer at this time. Most cancers happen by chance and this negative test suggests that her cancer may fall into this category.   ? ?An individual's cancer risk and medical management are not determined by genetic test results alone. Overall cancer risk assessment incorporates additional factors, including personal medical history, family history, and any available genetic information that may result in a personalized  plan for cancer prevention and surveillance. Therefore, it is recommended she continue to follow the cancer management and screening guidelines provided by her oncology and primary healthcare provider. ? ?TYRER

## 2021-09-18 NOTE — Telephone Encounter (Signed)
I attempted to contact Ms. Davidson-Mayer to discuss her genetic testing results. I left a voicemail requesting she call me back at [phone]. ? ?[name], MS, LCGC ?Genetic Counselor ?[email or web address] ?(P) [phone] ? ?

## 2021-09-19 ENCOUNTER — Telehealth: Payer: Self-pay | Admitting: Genetic Counselor

## 2021-09-19 ENCOUNTER — Encounter: Payer: Self-pay | Admitting: Genetic Counselor

## 2021-09-19 DIAGNOSIS — Z9189 Other specified personal risk factors, not elsewhere classified: Secondary | ICD-10-CM | POA: Insufficient documentation

## 2021-09-19 NOTE — Telephone Encounter (Signed)
I contacted Tricia Moore to discuss her genetic testing results. [facility] CancerNext-Expanded Panel identified a single pathogenic variant in the MUTYH gene. 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 Report date is 09/17/2021.  ? ?The test report has been scanned into EPIC and is located under the Molecular Pathology section of the Results Review tab.  A portion of the result report is included below for reference.  ? ?[name], MS, [place] ?Genetic Counselor ?[email or web address] ?(P) [phone] ? ? ?

## 2021-09-26 ENCOUNTER — Ambulatory Visit: Payer: 59 | Admitting: Behavioral Health

## 2021-09-27 ENCOUNTER — Encounter: Payer: Self-pay | Admitting: Genetic Counselor

## 2021-09-29 ENCOUNTER — Other Ambulatory Visit: Payer: Self-pay | Admitting: Internal Medicine

## 2021-10-04 ENCOUNTER — Ambulatory Visit: Payer: 59 | Admitting: Neurology

## 2021-10-09 ENCOUNTER — Other Ambulatory Visit (HOSPITAL_COMMUNITY): Payer: Self-pay

## 2021-10-09 ENCOUNTER — Telehealth (HOSPITAL_COMMUNITY): Payer: Self-pay | Admitting: Pharmacy Technician

## 2021-10-09 NOTE — Telephone Encounter (Signed)
Faxed Prior Glass blower/designer Form for Botox 200 to [place] [employer] [place].  Lyndel Safe, [place] Patient Advocate Specialist [place] Patient Advocate Team Direct Number: [phone]  Fax: [phone]

## 2021-10-11 NOTE — Telephone Encounter (Signed)
Patient Advocate Encounter  Received notification that the request for prior authorization for Botox 200 units has been denied due to not a covered benefit per plan documents.     Patient has an approval through [employer] through 08/17/2022.  Lyndel Safe, [place] Patient Advocate Specialist [place] Patient Advocate Team Direct Number: [phone]  Fax: [phone]

## 2021-10-17 ENCOUNTER — Other Ambulatory Visit: Payer: Self-pay | Admitting: Internal Medicine

## 2021-10-17 NOTE — Telephone Encounter (Signed)
Prescription for Synthroid 75 mcg was not received at Pharmacy.  Prescription was called  to the [facility] on [address].

## 2021-11-04 ENCOUNTER — Other Ambulatory Visit: Payer: Self-pay | Admitting: Obstetrics and Gynecology

## 2021-11-04 DIAGNOSIS — N898 Other specified noninflammatory disorders of vagina: Secondary | ICD-10-CM

## 2021-11-05 ENCOUNTER — Other Ambulatory Visit: Payer: Self-pay | Admitting: Obstetrics and Gynecology

## 2021-11-05 DIAGNOSIS — N898 Other specified noninflammatory disorders of vagina: Secondary | ICD-10-CM

## 2021-11-06 ENCOUNTER — Encounter: Payer: Self-pay | Admitting: Obstetrics and Gynecology

## 2021-12-10 ENCOUNTER — Other Ambulatory Visit: Payer: Self-pay | Admitting: *Deleted

## 2021-12-25 ENCOUNTER — Encounter (INDEPENDENT_AMBULATORY_CARE_PROVIDER_SITE_OTHER): Payer: Self-pay

## 2022-01-03 ENCOUNTER — Ambulatory Visit: Payer: 59 | Admitting: Neurology

## 2022-01-07 ENCOUNTER — Other Ambulatory Visit (HOSPITAL_COMMUNITY): Payer: Self-pay

## 2022-01-22 ENCOUNTER — Other Ambulatory Visit: Payer: Self-pay | Admitting: Neurosurgery

## 2022-01-22 DIAGNOSIS — M5416 Radiculopathy, lumbar region: Secondary | ICD-10-CM

## 2022-02-03 ENCOUNTER — Ambulatory Visit: Payer: 59 | Admitting: Neurology

## 2022-04-11 ENCOUNTER — Other Ambulatory Visit: Payer: Self-pay | Admitting: Internal Medicine

## 2022-09-04 ENCOUNTER — Other Ambulatory Visit (HOSPITAL_COMMUNITY): Payer: Self-pay

## 2022-10-22 ENCOUNTER — Encounter: Payer: Self-pay | Admitting: *Deleted

## 2023-03-30 ENCOUNTER — Telehealth: Payer: Self-pay

## 2023-03-30 NOTE — Transitions of Care (Post Inpatient/ED Visit) (Unsigned)
   03/30/2023  Name: Clea Caine MRN: [id number] DOB: 1970/08/23  Today's TOC FU Call Status: Today's TOC FU Call Status:: Unsuccessful Call (1st Attempt) Unsuccessful Call (1st Attempt) Date: 03/30/23  Attempted to reach the patient regarding the most recent Inpatient/ED visit.  Follow Up Plan: Additional outreach attempts will be made to reach the patient to complete the Transitions of Care (Post Inpatient/ED visit) call.   Signature Karena Addison, LPN [facility] Nurse Health Advisor Direct Dial [phone]

## 2023-04-02 NOTE — Transitions of Care (Post Inpatient/ED Visit) (Signed)
   04/02/2023  Name: Karalee Lesinski MRN: [id number] DOB: 02-Jan-1971  Today's TOC FU Call Status: Today's TOC FU Call Status:: Unsuccessful Call (3rd Attempt) Unsuccessful Call (1st Attempt) Date: 03/30/23 Unsuccessful Call (2nd Attempt) Date: 04/02/23 Unsuccessful Call (3rd Attempt) Date: 04/02/23  Attempted to reach the patient regarding the most recent Inpatient/ED visit.  Follow Up Plan: No further outreach attempts will be made at this time. We have been unable to contact the patient.  Signature Karena Addison, LPN [facility] Nurse Health Advisor Direct Dial [phone]

## 2023-04-02 NOTE — Transitions of Care (Post Inpatient/ED Visit) (Signed)
   04/02/2023  Name: Tricia Moore MRN: [id number] DOB: 1970-08-21  Today's TOC FU Call Status: Today's TOC FU Call Status:: Unsuccessful Call (2nd Attempt) Unsuccessful Call (1st Attempt) Date: 03/30/23 Unsuccessful Call (2nd Attempt) Date: 04/02/23  Attempted to reach the patient regarding the most recent Inpatient/ED visit.  Follow Up Plan: Additional outreach attempts will be made to reach the patient to complete the Transitions of Care (Post Inpatient/ED visit) call.   Signature Karena Addison, LPN [facility] Nurse Health Advisor Direct Dial [phone]

## 2023-10-11 ENCOUNTER — Other Ambulatory Visit: Payer: Self-pay

## 2023-10-11 ENCOUNTER — Encounter (HOSPITAL_BASED_OUTPATIENT_CLINIC_OR_DEPARTMENT_OTHER): Payer: Self-pay | Admitting: Emergency Medicine

## 2023-10-11 ENCOUNTER — Emergency Department (HOSPITAL_BASED_OUTPATIENT_CLINIC_OR_DEPARTMENT_OTHER)
Admission: EM | Admit: 2023-10-11 | Discharge: 2023-10-11 | Disposition: A | Discharge status: Home / Self Care | Attending: Emergency Medicine | Admitting: Emergency Medicine

## 2023-10-11 ENCOUNTER — Emergency Department (HOSPITAL_BASED_OUTPATIENT_CLINIC_OR_DEPARTMENT_OTHER)

## 2023-10-11 DIAGNOSIS — M25521 Pain in right elbow: Secondary | ICD-10-CM | POA: Insufficient documentation

## 2023-10-11 DIAGNOSIS — W208XXA Other cause of strike by thrown, projected or falling object, initial encounter: Secondary | ICD-10-CM | POA: Diagnosis not present

## 2023-10-11 DIAGNOSIS — S5011XA Contusion of right forearm, initial encounter: Secondary | ICD-10-CM | POA: Diagnosis not present

## 2023-10-11 DIAGNOSIS — M25531 Pain in right wrist: Secondary | ICD-10-CM | POA: Diagnosis present

## 2023-10-11 DIAGNOSIS — M7918 Myalgia, other site: Secondary | ICD-10-CM

## 2023-10-11 MED ORDER — FENTANYL CITRATE PF 50 MCG/ML IJ SOSY
50.0000 ug | PREFILLED_SYRINGE | INTRAMUSCULAR | Status: DC | PRN
  Administered 2023-10-11: 50 ug via INTRAVENOUS
  Filled 2023-10-11: qty 1

## 2023-10-11 NOTE — ED Provider Notes (Signed)
 [facility] AT [facility] Provider Note   CSN: [id number] Arrival date & time: 10/11/23  1842     History  Chief Complaint  Patient presents with   Tricia Moore    Tricia Moore is a 53 y.o. female presents today for a fall.  Patient states that she fell onto a wood pile.  Patient endorses right wrist and elbow pain.  Patient denies numbness, tingling, LOC, head injury, or blood thinner.   Fall       Home Medications Prior to Admission medications   Medication Sig Start Date End Date Taking? Authorizing Provider  Botulinum Toxin Type A  (BOTOX ) 200 units SOLR Inject 155 units IM into multiple site in the face,neck and head once every 90 days 08/30/21   Merriam Abbey, DO  buPROPion (WELLBUTRIN XL) 300 MG 24 hr tablet 1 tablet in the morning 03/18/21   [provider]  cycloSPORINE (RESTASIS) 0.05 % ophthalmic emulsion Place one drop into the left eye 2 (two) times daily. Patient not taking: Reported on 07/22/2021 08/16/20   [provider]  DULoxetine  (CYMBALTA ) 60 MG capsule Take 1 capsule (60 mg total) by mouth daily. 08/07/21   Gawaluck, Greylon, MD  EPINEPHrine 0.3 mg/0.3 mL IJ SOAJ injection INJECT 0.3 ML (0.3 MG DOSE) INTO THE MUSCLE ONCE AS NEEDED FOR ANAPHYLAXIS FOR UP TO 1 DOSE Patient not taking: Reported on 07/30/2021 07/30/20   [provider]  Erenumab -aooe (AIMOVIG ) 140 MG/ML SOAJ Inject 140 mg into the skin every 28 (twenty-eight) days. 07/30/21   Merriam Abbey, DO  fexofenadine (ALLEGRA) 180 MG tablet Take 180 mg by mouth daily.    [provider]  Flavoring Agent (ALFALFA FLAVOR) POWD Take by mouth.    [provider]  fluconazole (DIFLUCAN) 150 MG tablet  08/18/20   [provider]  fluticasone (FLONASE) 50 MCG/ACT nasal spray two sprays by Both Nostrils route daily.    [provider]  guaiFENesin (MUCINEX) 600 MG 12 hr tablet Take by mouth 2 (two) times daily.    [provider]  Hydrocortisone Acetate (CORTIFOAM) 10 % FOAM INSERT 1 APPLICATORFUL RECTALLY TWICE A DAY FOR 10 DAYS 04/18/20   [provider]  ibuprofen  (ADVIL ) 800 MG tablet Take 1 tablet (800 mg total) by mouth 3 (three) times daily. 04/10/20   Stephany Ehrich, MD  levothyroxine  (SYNTHROID ) 75 MCG tablet TAKE 1 TABLET BY MOUTH EVERY DAY 10/15/21   Christian, Rylee, MD  methocarbamol (ROBAXIN) 750 MG tablet 1 tablet Patient not taking: Reported on 07/22/2021    [provider]  montelukast (SINGULAIR) 10 MG tablet Take 10 mg by mouth at bedtime.  Patient not taking: Reported on 07/22/2021 03/14/19   [provider]  naproxen (NAPROSYN) 500 MG tablet TAKE 1 TABLET TWICE A DAY WITH MEALS FOR TOE PAIN. ALWAYS TAKE WITH FOOD Patient not taking: Reported on 07/30/2021 10/26/20   [provider]  OSPHENA  60 MG TABS Take 1 tablet by mouth daily. Patient not taking: Reported on 07/30/2021 07/22/21   Abigail Abler, MD  OVER THE COUNTER MEDICATION Med Name: [place] Vitamins - +D Boost 2,000 IU 8000 x2; OsteoMatrix 100% +DV of calcium and manganese; ALfalfa Complex; Vita Lea with Iron multivitamin.    [provider]  pregabalin  (LYRICA ) 75 MG capsule Take 1 capsule (75 mg total) by mouth 2 (two) times daily. 08/07/21   Gawaluck, Greylon, MD  Pseudoephedrine-guaiFENesin 40-400 MG TABS Take 1 tablet by mouth every 12 (  twelve) hours. Patient not taking: Reported on 07/22/2021 08/19/20   [provider]  Rimegepant Sulfate (NURTEC) 75 MG TBDP Take by mouth. 01/24/20   [provider]  terconazole (TERAZOL 3) 80 MG vaginal suppository Place 80 mg vaginally at bedtime as needed.     [provider]  testosterone (ANDROGEL) 50 MG/5GM (1%) GEL Place 5 g onto the skin daily.    [provider]  triamcinolone cream (KENALOG) 0.1 % Apply topically. Patient not taking: Reported on 07/22/2021 10/31/19   [provider]  zonisamide   (ZONEGRAN ) 100 MG capsule Take 1 capsule (100 mg total) by mouth daily. 07/30/21   Merriam Abbey, DO      Allergies    Bee venom, Penicillins, Hydrocodone-acetaminophen, Quinolones, and Alpha blocker quinazolines    Review of Systems   Review of Systems  Musculoskeletal:  Positive for arthralgias.    Physical Exam Updated Vital Signs BP 115/83 (BP Location: Right Arm)   Pulse 96   Temp 97.9 F (36.6 C)   Resp 18   SpO2 97%  Physical Exam Vitals and nursing note reviewed.  Constitutional:      General: She is not in acute distress.    Appearance: She is well-developed.  HENT:     Head: Normocephalic and atraumatic.     Right Ear: External ear normal.     Left Ear: External ear normal.     Nose: Nose normal.     Mouth/Throat:     Mouth: Mucous membranes are moist.  Eyes:     Extraocular Movements: Extraocular movements intact.     Conjunctiva/sclera: Conjunctivae normal.  Cardiovascular:     Rate and Rhythm: Normal rate and regular rhythm.     Heart sounds: No murmur heard. Pulmonary:     Effort: Pulmonary effort is normal. No respiratory distress.     Breath sounds: Normal breath sounds.  Abdominal:     Palpations: Abdomen is soft.     Tenderness: There is no abdominal tenderness.  Musculoskeletal:        General: Swelling, tenderness and signs of injury present.     Cervical back: Neck supple.     Comments: Patient with swelling and tenderness to proximal and distal right forearm with mild ecchymosis noted at the distal right forearm.  Patient is neurovascularly intact with +2 radial pulses.  ROM limited secondary to pain.  Skin:    General: Skin is warm and dry.     Capillary Refill: Capillary refill takes less than 2 seconds.  Neurological:     General: No focal deficit present.     Mental Status: She is alert and oriented to person, place, and time.  Psychiatric:        Mood and Affect: Mood normal.     ED Results / Procedures / Treatments   Labs (all  labs ordered are listed, but only abnormal results are displayed) Labs Reviewed - No data to display  EKG None  Radiology DG Elbow Complete Right Result Date: 10/11/2023 CLINICAL DATA:  Marvell Slider into a wood pile. EXAM: RIGHT ELBOW - COMPLETE 3+ VIEW COMPARISON:  None Available. FINDINGS: There is no evidence of an acute fracture, dislocation, or joint effusion. A small chronic appearing cortical opacity is seen along the lateral epicondyle of the distal right humerus. Mild soft tissue swelling is seen along the posterior aspect of the right olecranon process. IMPRESSION: Mild posterior soft tissue swelling without evidence of an acute osseous abnormality. Electronically Signed  By: Virgle Grime M.D.   On: 10/11/2023 19:59   DG Wrist Complete Right Result Date: 10/11/2023 CLINICAL DATA:  Status post fall into a wood pial. EXAM: RIGHT WRIST - COMPLETE 3+ VIEW COMPARISON:  None Available. FINDINGS: There is no evidence of fracture or dislocation. There is no evidence of arthropathy or other focal bone abnormality. Soft tissues are unremarkable. IMPRESSION: Negative. Electronically Signed   By: Virgle Grime M.D.   On: 10/11/2023 19:55    Procedures Procedures    Medications Ordered in ED Medications  fentaNYL (SUBLIMAZE) injection 50 mcg (50 mcg Intravenous Given 10/11/23 2003)    ED Course/ Medical Decision Making/ A&P                                 Medical Decision Making Amount and/or Complexity of Data Reviewed Radiology: ordered.  Risk Prescription drug management.   This patient presents to the ED for concern of fall differential diagnosis includes musculoskeletal pain, olecranon fracture, ulnar fracture, radial fracture, carpal fracture, laceration  Imaging Studies ordered:  I ordered imaging studies including right elbow and right wrist x-rays I independently visualized and interpreted imaging which showed negative I agree with the radiologist  interpretation   Medicines ordered and prescription drug management:  I ordered medication including Toradol    I have reviewed the patients home medicines and have made adjustments as needed   Problem List / ED Course:  Patient placed in sling for comfort and given wrist brace for support. Considered for admission or further workup however patient's vital signs, physical exam, and imaging been reassuring.  Patient's symptoms likely due to musculoskeletal pain.  Patient advised to rest, ice, compress, and elevate the affected limb.  Patient was advised to take Tylenol/Motrin  as needed for pain.  Patient given return precautions.  I feel patient is safe for discharge at this time.          Final Clinical Impression(s) / ED Diagnoses Final diagnoses:  Musculoskeletal pain    Rx / DC Orders ED Discharge Orders     None         Carie Charity, PA-C 10/11/23 [id number]    Russella Courts A, DO 10/15/23 2351

## 2023-10-11 NOTE — ED Triage Notes (Signed)
 Fall earlier today [place] onto a wood pile. Pain in right wrist and elbow. Denies loc, did not hit head

## 2023-10-11 NOTE — Discharge Instructions (Addendum)
 Today you are seen for musculoskeletal pain after a fall.  You may rest, ice, compress, and elevate the affected limb.  You may also alternate taking Tylenol/Motrin  as needed for pain.  Please return to the ED if you have uncontrollable vomiting, worsening pain, or fever.  Thank you for letting us  treat you today. After reviewing your imaging, I feel you are safe to go home. Please follow up with your PCP in the next several days and provide them with your records from this visit. Return to the Emergency Room if pain becomes severe or symptoms worsen.

## 2024-01-07 ENCOUNTER — Other Ambulatory Visit: Payer: Self-pay

## 2024-01-07 ENCOUNTER — Emergency Department (HOSPITAL_BASED_OUTPATIENT_CLINIC_OR_DEPARTMENT_OTHER)

## 2024-01-07 ENCOUNTER — Encounter (HOSPITAL_BASED_OUTPATIENT_CLINIC_OR_DEPARTMENT_OTHER): Payer: Self-pay | Admitting: Emergency Medicine

## 2024-01-07 ENCOUNTER — Emergency Department (HOSPITAL_BASED_OUTPATIENT_CLINIC_OR_DEPARTMENT_OTHER)
Admission: EM | Admit: 2024-01-07 | Discharge: 2024-01-07 | Disposition: A | Discharge status: Home / Self Care | Attending: Emergency Medicine | Admitting: Emergency Medicine

## 2024-01-07 DIAGNOSIS — S161XXA Strain of muscle, fascia and tendon at neck level, initial encounter: Secondary | ICD-10-CM | POA: Insufficient documentation

## 2024-01-07 DIAGNOSIS — M79674 Pain in right toe(s): Secondary | ICD-10-CM | POA: Diagnosis not present

## 2024-01-07 DIAGNOSIS — S39012A Strain of muscle, fascia and tendon of lower back, initial encounter: Secondary | ICD-10-CM | POA: Diagnosis not present

## 2024-01-07 DIAGNOSIS — M62830 Muscle spasm of back: Secondary | ICD-10-CM

## 2024-01-07 DIAGNOSIS — Y92410 Unspecified street and highway as the place of occurrence of the external cause: Secondary | ICD-10-CM | POA: Insufficient documentation

## 2024-01-07 DIAGNOSIS — S199XXA Unspecified injury of neck, initial encounter: Secondary | ICD-10-CM | POA: Diagnosis present

## 2024-01-07 MED ORDER — ACETAMINOPHEN 325 MG PO TABS
650.0000 mg | ORAL_TABLET | Freq: Once | ORAL | Status: AC
  Administered 2024-01-07: 650 mg via ORAL
  Filled 2024-01-07: qty 2

## 2024-01-07 MED ORDER — METHOCARBAMOL 500 MG PO TABS
500.0000 mg | ORAL_TABLET | Freq: Once | ORAL | Status: AC
  Administered 2024-01-07: 500 mg via ORAL
  Filled 2024-01-07: qty 1

## 2024-01-07 MED ORDER — LIDOCAINE 5 % EX PTCH
1.0000 | MEDICATED_PATCH | CUTANEOUS | Status: DC
  Administered 2024-01-07: 1 via TRANSDERMAL
  Filled 2024-01-07: qty 1

## 2024-01-07 MED ORDER — KETOROLAC TROMETHAMINE 30 MG/ML IJ SOLN
30.0000 mg | Freq: Once | INTRAMUSCULAR | Status: AC
  Administered 2024-01-07: 30 mg via INTRAMUSCULAR
  Filled 2024-01-07: qty 1

## 2024-01-07 MED ORDER — IBUPROFEN 800 MG PO TABS: 800.0000 mg | ORAL_TABLET | Freq: Three times a day (TID) | ORAL | 0 refills | Status: AC | PRN

## 2024-01-07 MED ORDER — METHOCARBAMOL 500 MG PO TABS
500.0000 mg | ORAL_TABLET | Freq: Two times a day (BID) | ORAL | 0 refills | Status: AC | PRN
Start: 2024-01-07 — End: ?
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
## 2024-01-07 NOTE — ED Notes (Signed)
 RN reviewed discharge instructions with pt. Pt verbalized understanding and had no further questions. VSS upon discharge.
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
